# Patient Record
Sex: Female | Born: 1945 | Race: White | Hispanic: No | Marital: Married | Smoking: Never smoker
Health system: Southern US, Community
[De-identification: ages and names within clinical notes are randomized; demographics above are authoritative.]

## PROBLEM LIST (undated history)

## (undated) DIAGNOSIS — M545 Low back pain, unspecified: Secondary | ICD-10-CM

## (undated) DIAGNOSIS — F329 Major depressive disorder, single episode, unspecified: Secondary | ICD-10-CM

## (undated) DIAGNOSIS — E785 Hyperlipidemia, unspecified: Secondary | ICD-10-CM

## (undated) DIAGNOSIS — G8929 Other chronic pain: Secondary | ICD-10-CM

## (undated) DIAGNOSIS — K219 Gastro-esophageal reflux disease without esophagitis: Secondary | ICD-10-CM

## (undated) DIAGNOSIS — F32A Depression, unspecified: Secondary | ICD-10-CM

## (undated) DIAGNOSIS — I10 Essential (primary) hypertension: Secondary | ICD-10-CM

## (undated) DIAGNOSIS — E119 Type 2 diabetes mellitus without complications: Secondary | ICD-10-CM

---

## 2004-03-10 ENCOUNTER — Ambulatory Visit: Payer: Self-pay | Admitting: Internal Medicine

## 2005-04-14 ENCOUNTER — Ambulatory Visit: Payer: Self-pay | Admitting: Internal Medicine

## 2005-08-01 ENCOUNTER — Ambulatory Visit: Payer: Self-pay | Admitting: Gastroenterology

## 2006-05-04 ENCOUNTER — Ambulatory Visit: Payer: Self-pay | Admitting: Internal Medicine

## 2007-05-07 ENCOUNTER — Ambulatory Visit: Payer: Self-pay | Admitting: Internal Medicine

## 2008-05-07 ENCOUNTER — Ambulatory Visit: Payer: Self-pay | Admitting: Internal Medicine

## 2008-05-21 ENCOUNTER — Ambulatory Visit: Payer: Self-pay | Admitting: Internal Medicine

## 2009-05-12 ENCOUNTER — Ambulatory Visit: Payer: Self-pay | Admitting: Internal Medicine

## 2010-05-13 ENCOUNTER — Ambulatory Visit: Payer: Self-pay | Admitting: Internal Medicine

## 2011-06-27 ENCOUNTER — Ambulatory Visit: Payer: Self-pay | Admitting: Internal Medicine

## 2012-06-27 ENCOUNTER — Ambulatory Visit: Payer: Self-pay | Admitting: Internal Medicine

## 2012-08-27 ENCOUNTER — Ambulatory Visit: Payer: Self-pay | Admitting: Internal Medicine

## 2012-09-06 ENCOUNTER — Ambulatory Visit: Payer: Self-pay | Admitting: Internal Medicine

## 2012-10-07 ENCOUNTER — Ambulatory Visit: Payer: Self-pay | Admitting: Internal Medicine

## 2012-11-06 ENCOUNTER — Ambulatory Visit: Payer: Self-pay | Admitting: Internal Medicine

## 2013-06-28 ENCOUNTER — Ambulatory Visit: Payer: Self-pay | Admitting: Internal Medicine

## 2014-07-14 ENCOUNTER — Ambulatory Visit: Payer: Self-pay | Admitting: Internal Medicine

## 2015-08-07 ENCOUNTER — Other Ambulatory Visit: Payer: Self-pay | Admitting: Internal Medicine

## 2015-08-07 DIAGNOSIS — Z1231 Encounter for screening mammogram for malignant neoplasm of breast: Secondary | ICD-10-CM

## 2015-08-21 ENCOUNTER — Other Ambulatory Visit: Payer: Self-pay | Admitting: Internal Medicine

## 2015-08-21 ENCOUNTER — Ambulatory Visit
Admission: RE | Admit: 2015-08-21 | Discharge: 2015-08-21 | Disposition: A | Discharge status: Home / Self Care | Payer: Medicare Other | Source: Ambulatory Visit | Attending: Internal Medicine | Admitting: Internal Medicine

## 2015-08-21 DIAGNOSIS — N6489 Other specified disorders of breast: Secondary | ICD-10-CM | POA: Insufficient documentation

## 2015-08-21 DIAGNOSIS — Z1231 Encounter for screening mammogram for malignant neoplasm of breast: Secondary | ICD-10-CM | POA: Diagnosis not present

## 2016-02-04 ENCOUNTER — Encounter: Payer: Self-pay | Admitting: *Deleted

## 2016-02-05 ENCOUNTER — Encounter
Admission: RE | Disposition: A | Discharge status: Home / Self Care | Payer: Self-pay | Source: Ambulatory Visit | Attending: Gastroenterology

## 2016-02-05 ENCOUNTER — Ambulatory Visit
Admission: RE | Admit: 2016-02-05 | Discharge: 2016-02-05 | Disposition: A | Discharge status: Home / Self Care | Payer: Medicare Other | Source: Ambulatory Visit | Attending: Gastroenterology | Admitting: Gastroenterology

## 2016-02-05 ENCOUNTER — Ambulatory Visit: Payer: Medicare Other | Admitting: Certified Registered"

## 2016-02-05 ENCOUNTER — Encounter: Payer: Self-pay | Admitting: *Deleted

## 2016-02-05 DIAGNOSIS — I10 Essential (primary) hypertension: Secondary | ICD-10-CM | POA: Insufficient documentation

## 2016-02-05 DIAGNOSIS — Z86010 Personal history of colonic polyps: Secondary | ICD-10-CM | POA: Insufficient documentation

## 2016-02-05 DIAGNOSIS — K219 Gastro-esophageal reflux disease without esophagitis: Secondary | ICD-10-CM | POA: Insufficient documentation

## 2016-02-05 DIAGNOSIS — E785 Hyperlipidemia, unspecified: Secondary | ICD-10-CM | POA: Diagnosis not present

## 2016-02-05 DIAGNOSIS — E119 Type 2 diabetes mellitus without complications: Secondary | ICD-10-CM | POA: Insufficient documentation

## 2016-02-05 DIAGNOSIS — Z7982 Long term (current) use of aspirin: Secondary | ICD-10-CM | POA: Insufficient documentation

## 2016-02-05 DIAGNOSIS — Z7984 Long term (current) use of oral hypoglycemic drugs: Secondary | ICD-10-CM | POA: Diagnosis not present

## 2016-02-05 DIAGNOSIS — Z800 Family history of malignant neoplasm of digestive organs: Secondary | ICD-10-CM | POA: Diagnosis not present

## 2016-02-05 DIAGNOSIS — K5730 Diverticulosis of large intestine without perforation or abscess without bleeding: Secondary | ICD-10-CM | POA: Diagnosis not present

## 2016-02-05 DIAGNOSIS — Z79899 Other long term (current) drug therapy: Secondary | ICD-10-CM | POA: Insufficient documentation

## 2016-02-05 HISTORY — DX: Essential (primary) hypertension: I10

## 2016-02-05 HISTORY — PX: COLONOSCOPY WITH PROPOFOL: SHX5780

## 2016-02-05 HISTORY — DX: Low back pain, unspecified: M54.50

## 2016-02-05 HISTORY — DX: Depression, unspecified: F32.A

## 2016-02-05 HISTORY — DX: Gastro-esophageal reflux disease without esophagitis: K21.9

## 2016-02-05 HISTORY — DX: Hyperlipidemia, unspecified: E78.5

## 2016-02-05 HISTORY — DX: Other chronic pain: G89.29

## 2016-02-05 HISTORY — DX: Low back pain: M54.5

## 2016-02-05 HISTORY — DX: Major depressive disorder, single episode, unspecified: F32.9

## 2016-02-05 HISTORY — DX: Type 2 diabetes mellitus without complications: E11.9

## 2016-02-05 LAB — GLUCOSE, CAPILLARY: GLUCOSE-CAPILLARY: 130 mg/dL — AB (ref 65–99)

## 2016-02-05 SURGERY — COLONOSCOPY WITH PROPOFOL
Anesthesia: General

## 2016-02-05 MED ORDER — PROPOFOL 10 MG/ML IV BOLUS
INTRAVENOUS | Status: DC | PRN
  Administered 2016-02-05: 50 mg via INTRAVENOUS

## 2016-02-05 MED ORDER — PROPOFOL 500 MG/50ML IV EMUL
INTRAVENOUS | Status: DC | PRN
  Administered 2016-02-05: 140 ug/kg/min via INTRAVENOUS

## 2016-02-05 MED ORDER — SODIUM CHLORIDE 0.9 % IV SOLN
INTRAVENOUS | Status: DC
  Administered 2016-02-05 (×2): via INTRAVENOUS

## 2016-02-05 MED ORDER — SODIUM CHLORIDE 0.9 % IV SOLN: INTRAVENOUS | Status: DC

## 2016-02-05 MED ORDER — LIDOCAINE HCL (CARDIAC) 20 MG/ML IV SOLN
INTRAVENOUS | Status: DC | PRN
  Administered 2016-02-05: 60 mg via INTRAVENOUS

## 2016-02-05 NOTE — Op Note (Signed)
[facility] Gastroenterology Patient Name: Patty Robinson Procedure Date: 02/05/2016 9:16 AM MRN: [id number] Account #: [id number] Date of Birth: 10/30/1945 Admit Type: Outpatient Age: 70 Room: [facility] ENDO ROOM 4 Gender: Female Note Status: Finalized Procedure:            Colonoscopy Indications:          Family history of colon cancer in a first-degree                        relative, Family history of colon cancer in a distant                        relative, Personal history of colonic polyps Providers:            Christena Deem, MD Referring MD:         Danella Penton, MD (Referring MD) Medicines:            Monitored Anesthesia Care Complications:        No immediate complications. Procedure:            Pre-Anesthesia Assessment:                       - ASA Grade Assessment: II - A patient with mild                        systemic disease.                       After obtaining informed consent, the colonoscope was                        passed under direct vision. Throughout the procedure,                        the patient's blood pressure, pulse, and oxygen                        saturations were monitored continuously. The                        Colonoscope was introduced through the anus and                        advanced to the the cecum, identified by appendiceal                        orifice and ileocecal valve. The colonoscopy was                        performed without difficulty. The patient tolerated the                        procedure well. The quality of the bowel preparation                        was good. Findings:      Multiple small and large-mouthed diverticula were found in the sigmoid       colon, descending colon, transverse colon and ascending colon.      The exam was otherwise without abnormality.      The  digital rectal exam was normal. Impression:           - Diverticulosis in the sigmoid colon, in the   descending colon, in the transverse colon and in the                        ascending colon.                       - The examination was otherwise normal.                       - No specimens collected. Recommendation:       - Repeat colonoscopy in 5 years for screening purposes. Procedure Code(s):    --- Professional ---                       [id number], Colonoscopy, flexible; diagnostic, including                        collection of specimen(s) by brushing or washing, when                        performed (separate procedure) Diagnosis Code(s):    --- Professional ---                       Z80.0, Family history of malignant neoplasm of                        digestive organs                       Z86.010, Personal history of colonic polyps                       K57.30, Diverticulosis of large intestine without                        perforation or abscess without bleeding CPT copyright 2016 [employer]. All rights reserved. The codes documented in this report are preliminary and upon coder review may  be revised to meet current compliance requirements. Christena DeemMartin U Skulskie, MD 02/05/2016 9:46:31 AM This report has been signed electronically. Number of Addenda: 0 Note Initiated On: 02/05/2016 9:16 AM Scope Withdrawal Time: 0 hours 5 minutes 20 seconds  Total Procedure Duration: 0 hours 10 minutes 33 seconds       [facility]

## 2016-02-05 NOTE — Anesthesia Preprocedure Evaluation (Signed)
Anesthesia Evaluation  Patient identified by MRN, date of birth, ID band Patient awake    Reviewed: Allergy & Precautions, H&P , NPO status , Patient's Chart, lab work & pertinent test results, reviewed documented beta blocker date and time   History of Anesthesia Complications Negative for: history of anesthetic complications  Airway Mallampati: I  TM Distance: >3 FB Neck ROM: full    Dental no notable dental hx. (+) Caps, Teeth Intact, Missing   Pulmonary neg pulmonary ROS,    Pulmonary exam normal breath sounds clear to auscultation       Cardiovascular Exercise Tolerance: Good hypertension, (-) angina(-) CAD, (-) Past MI, (-) Cardiac Stents and (-) CABG Normal cardiovascular exam(-) dysrhythmias (-) Valvular Problems/Murmurs Rhythm:regular Rate:Normal     Neuro/Psych PSYCHIATRIC DISORDERS (Depression) negative neurological ROS     GI/Hepatic Neg liver ROS, GERD  ,  Endo/Other  diabetes, Well Controlled, Oral Hypoglycemic Agents  Renal/GU negative Renal ROS  negative genitourinary   Musculoskeletal   Abdominal   Peds  Hematology negative hematology ROS (+)   Anesthesia Other Findings Past Medical History: No date: Chronic lumbar pain No date: Depression No date: Diabetes mellitus without complication (HCC) No date: Hyperlipidemia No date: Hypertension No date: Laryngopharyngeal reflux   Reproductive/Obstetrics negative OB ROS                             Anesthesia Physical Anesthesia Plan  ASA: II  Anesthesia Plan: General   Post-op Pain Management:    Induction:   Airway Management Planned:   Additional Equipment:   Intra-op Plan:   Post-operative Plan:   Informed Consent: I have reviewed the patients History and Physical, chart, labs and discussed the procedure including the risks, benefits and alternatives for the proposed anesthesia with the patient or authorized  representative who has indicated his/her understanding and acceptance.   Dental Advisory Given  Plan Discussed with: Anesthesiologist, CRNA and Surgeon  Anesthesia Plan Comments:         Anesthesia Quick Evaluation

## 2016-02-05 NOTE — Transfer of Care (Signed)
Immediate Anesthesia Transfer of Care Note  Patient: Tanya NonesJo A Goedken  Procedure(s) Performed: Procedure(s): COLONOSCOPY WITH PROPOFOL (N/A)  Patient Location: Endoscopy Unit  Anesthesia Type:General  Level of Consciousness: awake, alert , oriented and patient cooperative  Airway & Oxygen Therapy: Patient Spontanous Breathing and Patient connected to nasal cannula oxygen  Post-op Assessment: Report given to RN, Post -op Vital signs reviewed and stable and Patient moving all extremities X 4  Post vital signs: Reviewed and stable  Last Vitals:  Vitals:   02/05/16 0834  BP: 136/68  Pulse: 60  Resp: 14  Temp: 36.7 C    Last Pain:  Vitals:   02/05/16 0834  TempSrc: Tympanic         Complications: No apparent anesthesia complications

## 2016-02-05 NOTE — H&P (Signed)
Outpatient short stay form Pre-procedure 02/05/2016 9:00 AM Patty DeemMartin [name] MD  Primary Physician: Dr. Bethann PunchesMark Miller  Reason for visit:  Colonoscopy  History of present illness:  Patient is a 70 year old female presenting today as above. She has a personal history of adenomatous colon polyps and a family history of colon cancer in multiple relatives including a primary relative. She tolerated her prep well. She takes some 81 mg aspirin but has held that for a couple days. She takes no other aspirin products or blood thinning agents.    Current Facility-Administered Medications:  .  0.9 %  sodium chloride infusion, , Intravenous, Continuous, Patty DeemMartin U [name], MD, Last Rate: 20 mL/hr at 02/05/16 0856 .  0.9 %  sodium chloride infusion, , Intravenous, Continuous, Patty DeemMartin [name], MD  Prescriptions Prior to Admission  Medication Sig Dispense Refill Last Dose  . aspirin EC 81 MG tablet Take 81 mg by mouth daily.   02/03/2016 at 2100  . escitalopram (LEXAPRO) 10 MG tablet Take 10 mg by mouth daily.   02/04/2016 at Unknown time  . metFORMIN (GLUCOPHAGE) 500 MG tablet Take 500 mg by mouth daily with breakfast.   02/04/2016 at Unknown time  . ranitidine (ZANTAC) 150 MG capsule Take 150 mg by mouth 2 (two) times daily.   02/04/2016 at 2100  . telmisartan-hydrochlorothiazide (MICARDIS HCT) 80-12.5 MG tablet Take 1 tablet by mouth daily.   02/04/2016 at 2100  . vitamin E 400 UNIT capsule Take 400 Units by mouth daily.        Allergies  Allergen Reactions  . Other Hives    Macadamia Nuts 'causes itching and hives'     Past Medical History:  Diagnosis Date  . Chronic lumbar pain   . Depression   . Diabetes mellitus without complication (HCC)   . Hyperlipidemia   . Hypertension   . Laryngopharyngeal reflux     Review of systems:      Physical Exam    Heart and lungs: Regular rate and rhythm without rub or gallop, lungs are bilaterally clear.    HEENT: Normocephalic atraumatic eyes  are anicteric    Other:     Pertinant exam for procedure: Soft nontender nondistended bowel sounds positive normoactive.    Planned proceedures: Colonoscopy with indicated procedures. I have discussed the risks benefits and complications of procedures to include not limited to bleeding, infection, perforation and the risk of sedation and the patient wishes to proceed.    Patty DeemMartin U Orva Gwaltney, MD Gastroenterology 02/05/2016  9:00 AM

## 2016-02-05 NOTE — Anesthesia Postprocedure Evaluation (Signed)
Anesthesia Post Note  Patient: Patty Robinson  Procedure(s) Performed: Procedure(s) (LRB): COLONOSCOPY WITH PROPOFOL (N/A)  Patient location during evaluation: Endoscopy Anesthesia Type: General Level of consciousness: awake and alert Pain management: pain level controlled Vital Signs Assessment: post-procedure vital signs reviewed and stable Respiratory status: spontaneous breathing, nonlabored ventilation, respiratory function stable and patient connected to nasal cannula oxygen Cardiovascular status: blood pressure returned to baseline and stable Postop Assessment: no signs of nausea or vomiting Anesthetic complications: no    Last Vitals:  Vitals:   02/05/16 0834 02/05/16 0940  BP: 136/68 103/62  Pulse: 60 60  Resp: 14 18  Temp: 36.7 C (!) 35.7 C    Last Pain:  Vitals:   02/05/16 0940  TempSrc: Tympanic                 Lenard SimmerAndrew Laval Cafaro

## 2016-02-08 ENCOUNTER — Encounter: Payer: Self-pay | Admitting: Gastroenterology

## 2016-08-15 ENCOUNTER — Other Ambulatory Visit: Payer: Self-pay | Admitting: Internal Medicine

## 2016-08-15 DIAGNOSIS — Z1231 Encounter for screening mammogram for malignant neoplasm of breast: Secondary | ICD-10-CM

## 2016-09-07 ENCOUNTER — Ambulatory Visit
Admission: RE | Admit: 2016-09-07 | Discharge: 2016-09-07 | Disposition: A | Discharge status: Home / Self Care | Payer: Medicare Other | Source: Ambulatory Visit | Attending: Internal Medicine | Admitting: Internal Medicine

## 2016-09-07 DIAGNOSIS — Z1231 Encounter for screening mammogram for malignant neoplasm of breast: Secondary | ICD-10-CM | POA: Diagnosis present

## 2017-08-10 ENCOUNTER — Other Ambulatory Visit: Payer: Self-pay | Admitting: Internal Medicine

## 2017-08-10 DIAGNOSIS — Z1231 Encounter for screening mammogram for malignant neoplasm of breast: Secondary | ICD-10-CM

## 2017-09-08 ENCOUNTER — Ambulatory Visit
Admission: RE | Admit: 2017-09-08 | Discharge: 2017-09-08 | Disposition: A | Discharge status: Home / Self Care | Payer: Medicare Other | Source: Ambulatory Visit | Attending: Internal Medicine | Admitting: Internal Medicine

## 2017-09-08 DIAGNOSIS — Z1231 Encounter for screening mammogram for malignant neoplasm of breast: Secondary | ICD-10-CM | POA: Diagnosis not present

## 2018-08-15 ENCOUNTER — Other Ambulatory Visit: Payer: Self-pay | Admitting: Internal Medicine

## 2018-08-15 DIAGNOSIS — Z1231 Encounter for screening mammogram for malignant neoplasm of breast: Secondary | ICD-10-CM

## 2018-10-08 ENCOUNTER — Other Ambulatory Visit: Payer: Self-pay

## 2018-10-08 ENCOUNTER — Ambulatory Visit
Admission: RE | Admit: 2018-10-08 | Discharge: 2018-10-08 | Disposition: A | Discharge status: Home / Self Care | Payer: Medicare Other | Source: Ambulatory Visit | Attending: Internal Medicine | Admitting: Internal Medicine

## 2018-10-08 DIAGNOSIS — Z1231 Encounter for screening mammogram for malignant neoplasm of breast: Secondary | ICD-10-CM | POA: Insufficient documentation

## 2019-09-03 ENCOUNTER — Other Ambulatory Visit: Payer: Self-pay | Admitting: Internal Medicine

## 2019-09-03 DIAGNOSIS — Z1231 Encounter for screening mammogram for malignant neoplasm of breast: Secondary | ICD-10-CM

## 2019-10-10 ENCOUNTER — Ambulatory Visit
Admission: RE | Admit: 2019-10-10 | Discharge: 2019-10-10 | Disposition: A | Discharge status: Home / Self Care | Payer: Medicare PPO | Source: Ambulatory Visit | Attending: Internal Medicine | Admitting: Internal Medicine

## 2019-10-10 DIAGNOSIS — Z1231 Encounter for screening mammogram for malignant neoplasm of breast: Secondary | ICD-10-CM | POA: Insufficient documentation

## 2020-09-04 ENCOUNTER — Other Ambulatory Visit: Payer: Self-pay | Admitting: Internal Medicine

## 2020-09-04 DIAGNOSIS — Z1231 Encounter for screening mammogram for malignant neoplasm of breast: Secondary | ICD-10-CM

## 2020-10-13 ENCOUNTER — Other Ambulatory Visit: Payer: Self-pay

## 2020-10-13 ENCOUNTER — Ambulatory Visit
Admission: RE | Admit: 2020-10-13 | Discharge: 2020-10-13 | Disposition: A | Discharge status: Home / Self Care | Payer: Medicare PPO | Source: Ambulatory Visit | Attending: Internal Medicine | Admitting: Internal Medicine

## 2020-10-13 DIAGNOSIS — Z1231 Encounter for screening mammogram for malignant neoplasm of breast: Secondary | ICD-10-CM | POA: Insufficient documentation

## 2021-08-09 ENCOUNTER — Ambulatory Visit: Payer: Medicare PPO | Admitting: Anesthesiology

## 2021-08-09 ENCOUNTER — Ambulatory Visit
Admission: RE | Admit: 2021-08-09 | Discharge: 2021-08-09 | Disposition: A | Discharge status: Home / Self Care | Payer: Medicare PPO | Attending: Gastroenterology | Admitting: Gastroenterology

## 2021-08-09 ENCOUNTER — Encounter: Payer: Self-pay | Admitting: *Deleted

## 2021-08-09 ENCOUNTER — Other Ambulatory Visit: Payer: Self-pay

## 2021-08-09 ENCOUNTER — Encounter
Admission: RE | Disposition: A | Discharge status: Home / Self Care | Payer: Self-pay | Source: Home / Self Care | Attending: Gastroenterology

## 2021-08-09 DIAGNOSIS — Z800 Family history of malignant neoplasm of digestive organs: Secondary | ICD-10-CM | POA: Insufficient documentation

## 2021-08-09 DIAGNOSIS — Z79899 Other long term (current) drug therapy: Secondary | ICD-10-CM | POA: Diagnosis not present

## 2021-08-09 DIAGNOSIS — Z7984 Long term (current) use of oral hypoglycemic drugs: Secondary | ICD-10-CM | POA: Diagnosis not present

## 2021-08-09 DIAGNOSIS — Z86010 Personal history of colonic polyps: Secondary | ICD-10-CM | POA: Insufficient documentation

## 2021-08-09 DIAGNOSIS — K219 Gastro-esophageal reflux disease without esophagitis: Secondary | ICD-10-CM | POA: Insufficient documentation

## 2021-08-09 DIAGNOSIS — F32A Depression, unspecified: Secondary | ICD-10-CM | POA: Diagnosis not present

## 2021-08-09 DIAGNOSIS — K5730 Diverticulosis of large intestine without perforation or abscess without bleeding: Secondary | ICD-10-CM | POA: Insufficient documentation

## 2021-08-09 DIAGNOSIS — K640 First degree hemorrhoids: Secondary | ICD-10-CM | POA: Insufficient documentation

## 2021-08-09 DIAGNOSIS — K2289 Other specified disease of esophagus: Secondary | ICD-10-CM | POA: Insufficient documentation

## 2021-08-09 DIAGNOSIS — E119 Type 2 diabetes mellitus without complications: Secondary | ICD-10-CM | POA: Diagnosis not present

## 2021-08-09 DIAGNOSIS — K317 Polyp of stomach and duodenum: Secondary | ICD-10-CM | POA: Insufficient documentation

## 2021-08-09 DIAGNOSIS — I10 Essential (primary) hypertension: Secondary | ICD-10-CM | POA: Insufficient documentation

## 2021-08-09 DIAGNOSIS — Z1211 Encounter for screening for malignant neoplasm of colon: Secondary | ICD-10-CM | POA: Diagnosis present

## 2021-08-09 HISTORY — PX: COLONOSCOPY WITH PROPOFOL: SHX5780

## 2021-08-09 HISTORY — PX: ESOPHAGOGASTRODUODENOSCOPY (EGD) WITH PROPOFOL: SHX5813

## 2021-08-09 LAB — GLUCOSE, CAPILLARY: Glucose-Capillary: 128 mg/dL — ABNORMAL HIGH (ref 70–99)

## 2021-08-09 SURGERY — COLONOSCOPY WITH PROPOFOL
Anesthesia: General

## 2021-08-09 MED ORDER — PROPOFOL 500 MG/50ML IV EMUL
INTRAVENOUS | Status: AC
  Filled 2021-08-09: qty 500

## 2021-08-09 MED ORDER — PROPOFOL 10 MG/ML IV BOLUS
INTRAVENOUS | Status: DC | PRN
  Administered 2021-08-09: 20 mg via INTRAVENOUS
  Administered 2021-08-09: 30 mg via INTRAVENOUS

## 2021-08-09 MED ORDER — PROPOFOL 500 MG/50ML IV EMUL
INTRAVENOUS | Status: DC | PRN
  Administered 2021-08-09: 50 ug/kg/min via INTRAVENOUS

## 2021-08-09 MED ORDER — SODIUM CHLORIDE 0.9 % IV SOLN: INTRAVENOUS | Status: DC

## 2021-08-09 MED ORDER — MIDAZOLAM HCL 2 MG/2ML IJ SOLN
INTRAMUSCULAR | Status: AC
  Filled 2021-08-09: qty 2

## 2021-08-09 MED ORDER — MIDAZOLAM HCL 2 MG/2ML IJ SOLN
INTRAMUSCULAR | Status: DC | PRN
  Administered 2021-08-09: 2 mg via INTRAVENOUS

## 2021-08-09 MED ORDER — FENTANYL CITRATE (PF) 100 MCG/2ML IJ SOLN
INTRAMUSCULAR | Status: AC
Start: 2021-08-09 — End: ?
  Filled 2021-08-09: qty 2

## 2021-08-09 MED ORDER — FENTANYL CITRATE (PF) 100 MCG/2ML IJ SOLN
INTRAMUSCULAR | Status: DC | PRN
Start: 2021-08-09 — End: 2021-08-09
  Administered 2021-08-09: 50 ug via INTRAVENOUS
  Administered 2021-08-09 (×2): 25 ug via INTRAVENOUS

## 2021-08-09 MED ORDER — LIDOCAINE HCL (CARDIAC) PF 100 MG/5ML IV SOSY
PREFILLED_SYRINGE | INTRAVENOUS | Status: DC | PRN
  Administered 2021-08-09: 50 mg via INTRAVENOUS

## 2021-08-09 NOTE — Transfer of Care (Signed)
Immediate Anesthesia Transfer of Care Note ? ?Patient: Patty Robinson ? ?Procedure(s) Performed: COLONOSCOPY WITH PROPOFOL ?ESOPHAGOGASTRODUODENOSCOPY (EGD) WITH PROPOFOL ? ?Patient Location: PACU ? ?Anesthesia Type:General ? ?Level of Consciousness: sedated ? ?Airway & Oxygen Therapy: Patient Spontanous Breathing and Patient connected to nasal cannula oxygen ? ?Post-op Assessment: Report given to RN and Post -op Vital signs reviewed and stable ? ?Post vital signs: Reviewed and stable ? ?Last Vitals:  ?Vitals Value Taken Time  ?BP    ?Temp    ?Pulse    ?Resp    ?SpO2    ? ? ?Last Pain:  ?Vitals:  ? 08/09/21 0727  ?TempSrc: Temporal  ?PainSc: 0-No pain  ?   ? ?  ? ?Complications: No notable events documented. ?

## 2021-08-09 NOTE — H&P (Signed)
Outpatient short stay form Pre-procedure ?08/09/2021  ?Regis Bill, MD ? ?Primary Physician: Danella Penton, MD ? ?Reason for visit:  GERD/Surveillance and family history of colon cancer ? ?History of present illness:   ? ?76 y/o lady with history of GERD diagnosed by hoarseness that responded to PPI here for EGD/Colonoscopy. Brother had colon cancer in his late 37's. Last colonoscopy in 2017 with diverticulosis but has reported history of adenomatous polyp. No blood thinners. No significant abdominal surgeries. ? ? ? ?Current Facility-Administered Medications:  ?  0.9 %  sodium chloride infusion, , Intravenous, Continuous, Delsie Amador, Rossie Muskrat, MD, Last Rate: 20 mL/hr at 08/09/21 0743, New Bag at 08/09/21 0743 ? ?Medications Prior to Admission  ?Medication Sig Dispense Refill Last Dose  ? aspirin EC 81 MG tablet Take 81 mg by mouth daily.   Past Week  ? Cholecalciferol 125 MCG (5000 UT) capsule Take 5,000 Units by mouth daily.   Past Week  ? escitalopram (LEXAPRO) 10 MG tablet Take 10 mg by mouth daily.   Past Week  ? hydrochlorothiazide (HYDRODIURIL) 25 MG tablet Take 25 mg by mouth daily.   Past Week  ? metFORMIN (GLUCOPHAGE) 500 MG tablet Take 500 mg by mouth daily with breakfast.   Past Week  ? pantoprazole (PROTONIX) 40 MG tablet Take 40 mg by mouth daily.   08/08/2021  ? ranitidine (ZANTAC) 150 MG capsule Take 150 mg by mouth 2 (two) times daily.   Past Week  ? simvastatin (ZOCOR) 10 MG tablet Take 10 mg by mouth daily.   Past Week  ? telmisartan-hydrochlorothiazide (MICARDIS HCT) 80-12.5 MG tablet Take 1 tablet by mouth daily.   Past Week  ? vitamin E 400 UNIT capsule Take 400 Units by mouth daily.   Past Week  ? ? ? ?Allergies  ?Allergen Reactions  ? Other Hives  ?  Macadamia Nuts ?'causes itching and hives'  ? ? ? ?Past Medical History:  ?Diagnosis Date  ? Chronic lumbar pain   ? Depression   ? Diabetes mellitus without complication (HCC)   ? Hyperlipidemia   ? Hypertension   ? Laryngopharyngeal reflux    ? ? ?Review of systems:  Otherwise negative.  ? ? ?Physical Exam ? ?Gen: Alert, oriented. Appears stated age.  ?HEENT: PERRLA. ?Lungs: No respiratory distress ?CV: RRR ?Abd: soft, benign, no masses ?Ext: No edema ? ? ? ?Planned procedures: Proceed with EGD/colonoscopy. The patient understands the nature of the planned procedure, indications, risks, alternatives and potential complications including but not limited to bleeding, infection, perforation, damage to internal organs and possible oversedation/side effects from anesthesia. The patient agrees and gives consent to proceed.  ?Please refer to procedure notes for findings, recommendations and patient disposition/instructions.  ? ? ? ?Regis Bill, MD ?[facility] ? ? ? ?  ? ?

## 2021-08-09 NOTE — Op Note (Signed)
[facility] ?Gastroenterology ?Patient Name: Patty Robinson ?Procedure Date: 08/09/2021 8:09 AM ?MRN: [id number] ?Account #: [id number] ?Date of Birth: Dec 03, 1945 ?Admit Type: Outpatient ?Age: 76 ?Room: [facility] ENDO ROOM 3 ?Gender: Female ?Note Status: Finalized ?Instrument Name: Colonscope [id number] ?Procedure:             Colonoscopy ?Indications:           High risk colon cancer surveillance: Personal history  ?                       of colonic polyps, Family history of colon cancer in a  ?                       first-degree relative before age 66 years, Last  ?                       colonoscopy 5 years ago ?Providers:             Andrey Farmer MD, MD ?Medicines:             Monitored Anesthesia Care ?Complications:         No immediate complications. ?Procedure:             Pre-Anesthesia Assessment: ?                       - Prior to the procedure, a History and Physical was  ?                       performed, and patient medications and allergies were  ?                       reviewed. The patient is competent. The risks and  ?                       benefits of the procedure and the sedation options and  ?                       risks were discussed with the patient. All questions  ?                       were answered and informed consent was obtained.  ?                       Patient identification and proposed procedure were  ?                       verified by the physician, the nurse, the  ?                       anesthesiologist, the anesthetist and the technician  ?                       in the endoscopy suite. Mental Status Examination:  ?                       alert and oriented. Airway Examination: normal  ?                       oropharyngeal airway and neck mobility.  Respiratory  ?                       Examination: clear to auscultation. CV Examination:  ?                       normal. Prophylactic Antibiotics: The patient does not  ?                       require prophylactic  antibiotics. Prior  ?                       Anticoagulants: The patient has taken no previous  ?                       anticoagulant or antiplatelet agents. ASA Grade  ?                       Assessment: II - A patient with mild systemic disease.  ?                       After reviewing the risks and benefits, the patient  ?                       was deemed in satisfactory condition to undergo the  ?                       procedure. The anesthesia plan was to use monitored  ?                       anesthesia care (MAC). Immediately prior to  ?                       administration of medications, the patient was  ?                       re-assessed for adequacy to receive sedatives. The  ?                       heart rate, respiratory rate, oxygen saturations,  ?                       blood pressure, adequacy of pulmonary ventilation, and  ?                       response to care were monitored throughout the  ?                       procedure. The physical status of the patient was  ?                       re-assessed after the procedure. ?                       After obtaining informed consent, the colonoscope was  ?                       passed under direct vision. Throughout the procedure,  ?  the patient's blood pressure, pulse, and oxygen  ?                       saturations were monitored continuously. The  ?                       Colonoscope was introduced through the anus and  ?                       advanced to the the cecum, identified by appendiceal  ?                       orifice and ileocecal valve. The colonoscopy was  ?                       performed without difficulty. The patient tolerated  ?                       the procedure well. The quality of the bowel  ?                       preparation was good. ?Findings: ?     The perianal and digital rectal examinations were normal. ?     A few small-mouthed diverticula were found in the sigmoid colon,  ?     descending colon,  transverse colon and hepatic flexure. ?     Internal hemorrhoids were found during retroflexion. The hemorrhoids  ?     were Grade I (internal hemorrhoids that do not prolapse). ?     The exam was otherwise without abnormality on direct and retroflexion  ?     views. ?Impression:            - Diverticulosis in the sigmoid colon, in the  ?                       descending colon, in the transverse colon and at the  ?                       hepatic flexure. ?                       - Internal hemorrhoids. ?                       - The examination was otherwise normal on direct and  ?                       retroflexion views. ?                       - No specimens collected. ?Recommendation:        - Discharge patient to home. ?                       - Resume previous diet. ?                       - Continue present medications. ?                       - Repeat colonoscopy in 5 years for surveillance. ?                       -  Return to referring physician as previously  ?                       scheduled. ?Procedure Code(s):     --- Professional --- ?                       G0105, Colorectal cancer screening; colonoscopy on  ?                       individual at high risk ?Diagnosis Code(s):     --- Professional --- ?                       Z86.010, Personal history of colonic polyps ?                       K64.0, First degree hemorrhoids ?                       Z80.0, Family history of malignant neoplasm of  ?                       digestive organs ?                       K57.30, Diverticulosis of large intestine without  ?                       perforation or abscess without bleeding ?CPT copyright 2019 American Medical Association. All rights reserved. ?The codes documented in this report are preliminary and upon coder review may  ?be revised to meet current compliance requirements. ?Andrey Farmer MD, MD ?08/09/2021 8:56:18 AM ?Number of Addenda: 0 ?Note Initiated On: 08/09/2021 8:09 AM ?Scope Withdrawal Time: 0 hours 6  minutes 44 seconds  ?Total Procedure Duration: 0 hours 12 minutes 37 seconds  ?Estimated Blood Loss:  Estimated blood loss: none. ?     [facility] ?

## 2021-08-09 NOTE — Op Note (Signed)
[facility] ?Gastroenterology ?Patient Name: Patty Robinson ?Procedure Date: 08/09/2021 8:10 AM ?MRN: [id number] ?Account #: [id number] ?Date of Birth: August 21, 1945 ?Admit Type: Outpatient ?Age: 77 ?Room: [facility] ENDO ROOM 3 ?Gender: Female ?Note Status: Finalized ?Instrument Name: Upper Endoscope [id number] ?Procedure:             Upper GI endoscopy ?Indications:           Gastro-esophageal reflux disease ?Providers:             Andrey Farmer MD, MD ?Medicines:             Monitored Anesthesia Care ?Complications:         No immediate complications. Estimated blood loss:  ?                       Minimal. ?Procedure:             Pre-Anesthesia Assessment: ?                       - Prior to the procedure, a History and Physical was  ?                       performed, and patient medications and allergies were  ?                       reviewed. The patient is competent. The risks and  ?                       benefits of the procedure and the sedation options and  ?                       risks were discussed with the patient. All questions  ?                       were answered and informed consent was obtained.  ?                       Patient identification and proposed procedure were  ?                       verified by the physician, the nurse, the  ?                       anesthesiologist, the anesthetist and the technician  ?                       in the endoscopy suite. Mental Status Examination:  ?                       alert and oriented. Airway Examination: normal  ?                       oropharyngeal airway and neck mobility. Respiratory  ?                       Examination: clear to auscultation. CV Examination:  ?                       normal. Prophylactic Antibiotics: The patient does not  ?  require prophylactic antibiotics. Prior  ?                       Anticoagulants: The patient has taken no previous  ?                       anticoagulant or antiplatelet agents. ASA Grade   ?                       Assessment: II - A patient with mild systemic disease.  ?                       After reviewing the risks and benefits, the patient  ?                       was deemed in satisfactory condition to undergo the  ?                       procedure. The anesthesia plan was to use monitored  ?                       anesthesia care (MAC). Immediately prior to  ?                       administration of medications, the patient was  ?                       re-assessed for adequacy to receive sedatives. The  ?                       heart rate, respiratory rate, oxygen saturations,  ?                       blood pressure, adequacy of pulmonary ventilation, and  ?                       response to care were monitored throughout the  ?                       procedure. The physical status of the patient was  ?                       re-assessed after the procedure. ?                       After obtaining informed consent, the endoscope was  ?                       passed under direct vision. Throughout the procedure,  ?                       the patient's blood pressure, pulse, and oxygen  ?                       saturations were monitored continuously. The Endoscope  ?                       was introduced through the mouth, and advanced to the  ?  second part of duodenum. The upper GI endoscopy was  ?                       accomplished without difficulty. The patient tolerated  ?                       the procedure well. ?Findings: ?     The examined esophagus was normal. Biopsies were obtained from the  ?     proximal and distal esophagus with cold forceps for histology of  ?     suspected eosinophilic esophagitis. Estimated blood loss was minimal. ?     A few small sessile fundic gland polyps with no bleeding and no stigmata  ?     of recent bleeding were found in the gastric body. Biopsies were taken  ?     with a cold forceps for histology. Estimated blood loss was minimal. ?      The exam of the stomach was otherwise normal. ?     The examined duodenum was normal. ?Impression:            - Normal esophagus. Biopsied. ?                       - A few fundic gland polyps. Biopsied. ?                       - Normal examined duodenum. ?Recommendation:        - Discharge patient to home. ?                       - Resume previous diet. ?                       - Continue present medications. ?                       - Await pathology results. ?                       - Perform a colonoscopy today. ?Procedure Code(s):     --- Professional --- ?                       [phone], Esophagogastroduodenoscopy, flexible,  ?                       transoral; with biopsy, single or multiple ?Diagnosis Code(s):     --- Professional --- ?                       K31.7, Polyp of stomach and duodenum ?                       K21.9, Gastro-esophageal reflux disease without  ?                       esophagitis ?CPT copyright 2019 [employer]. All rights reserved. ?The codes documented in this report are preliminary and upon coder review may  ?be revised to meet current compliance requirements. ?Andrey Farmer MD, MD ?08/09/2021 8:52:46 AM ?Number of Addenda: 0 ?Note Initiated On: 08/09/2021 8:10 AM ?Estimated Blood Loss:  Estimated blood loss was minimal. ?  [facility] ?

## 2021-08-09 NOTE — Interval H&P Note (Signed)
History and Physical Interval Note: ? ?08/09/2021 ?8:19 AM ? ?Patty Robinson  has presented today for surgery, with the diagnosis of Z86.010  - History of colon polyps ?K21.9 - Gastroesophageal reflux disease, unspecified whether esophagitis present.  The various methods of treatment have been discussed with the patient and family. After consideration of risks, benefits and other options for treatment, the patient has consented to  Procedure(s): ?COLONOSCOPY WITH PROPOFOL (N/A) ?ESOPHAGOGASTRODUODENOSCOPY (EGD) WITH PROPOFOL (N/A) as a surgical intervention.  The patient's history has been reviewed, patient examined, no change in status, stable for surgery.  I have reviewed the patient's chart and labs.  Questions were answered to the patient's satisfaction.   ? ? ?Patty Robinson ? ?Ok to proceed with EGD/Colonoscopy ?

## 2021-08-09 NOTE — Anesthesia Postprocedure Evaluation (Signed)
Anesthesia Post Note ? ?Patient: Patty Robinson ? ?Procedure(s) Performed: COLONOSCOPY WITH PROPOFOL ?ESOPHAGOGASTRODUODENOSCOPY (EGD) WITH PROPOFOL ? ?Patient location during evaluation: PACU ?Anesthesia Type: General ?Level of consciousness: awake and alert ?Pain management: pain level controlled ?Vital Signs Assessment: post-procedure vital signs reviewed and stable ?Respiratory status: spontaneous breathing, nonlabored ventilation, respiratory function stable and patient connected to nasal cannula oxygen ?Cardiovascular status: blood pressure returned to baseline and stable ?Postop Assessment: no apparent nausea or vomiting ?Anesthetic complications: no ? ? ?No notable events documented. ? ? ?Last Vitals:  ?Vitals:  ? 08/09/21 0910 08/09/21 0920  ?BP: 134/69 124/61  ?Pulse: 60 (!) 57  ?Resp: 10 17  ?Temp:    ?SpO2: 100% 99%  ?  ?Last Pain:  ?Vitals:  ? 08/09/21 0850  ?TempSrc: Temporal  ?PainSc:   ? ? ?  ?  ?  ?  ?  ?  ? ?Yevette Edwards ? ? ? ? ?

## 2021-08-09 NOTE — Anesthesia Preprocedure Evaluation (Signed)
Anesthesia Evaluation  ?Patient identified by MRN, date of birth, ID band ?Patient awake ? ? ? ?Reviewed: ?Allergy & Precautions, H&P , NPO status , Patient's Chart, lab work & pertinent test results, reviewed documented beta blocker date and time  ? ?Airway ?Mallampati: II ? ?TM Distance: <3 FB ?Neck ROM: full ? ? ? Dental ? ?(+) Poor Dentition ?  ?Pulmonary ?neg pulmonary ROS,  ?  ?Pulmonary exam normal ? ? ? ? ? ? ? Cardiovascular ?Exercise Tolerance: Poor ?hypertension, On Medications ?negative cardio ROS ?Normal cardiovascular exam ?Rhythm:regular Rate:Normal ? ? ?  ?Neuro/Psych ?Depression negative neurological ROS ? negative psych ROS  ? GI/Hepatic ?negative GI ROS, Neg liver ROS,   ?Endo/Other  ?negative endocrine ROSdiabetes, Well Controlled, Type 2, Oral Hypoglycemic Agents ? Renal/GU ?negative Renal ROS  ?negative genitourinary ?  ?Musculoskeletal ? ? Abdominal ?  ?Peds ? Hematology ?negative hematology ROS ?(+)   ?Anesthesia Other Findings ?Past Medical History: ?No date: Chronic lumbar pain ?No date: Depression ?No date: Diabetes mellitus without complication (HCC) ?No date: Hyperlipidemia ?No date: Hypertension ?No date: Laryngopharyngeal reflux ?Past Surgical History: ?02/05/2016: COLONOSCOPY WITH PROPOFOL; N/A ?    Comment:  Procedure: COLONOSCOPY WITH PROPOFOL;  Surgeon: Cindra Eves ?             Marva Panda, MD;  Location: [facility] ENDOSCOPY;  Service:  ?             Endoscopy;  Laterality: N/A; ?BMI   ? Body Mass Index: 32.58 kg/m?  ?  ? Reproductive/Obstetrics ?negative OB ROS ? ?  ? ? ? ? ? ? ? ? ? ? ? ? ? ?  ?  ? ? ? ? ? ? ? ? ?Anesthesia Physical ?Anesthesia Plan ? ?ASA: 2 ? ?Anesthesia Plan: General  ? ?Post-op Pain Management:   ? ?Induction:  ? ?PONV Risk Score and Plan:  ? ?Airway Management Planned:  ? ?Additional Equipment:  ? ?Intra-op Plan:  ? ?Post-operative Plan:  ? ?Informed Consent: I have reviewed the patients History and Physical, chart, labs and discussed  the procedure including the risks, benefits and alternatives for the proposed anesthesia with the patient or authorized representative who has indicated his/her understanding and acceptance.  ? ? ? ?Dental Advisory Given ? ?Plan Discussed with: CRNA ? ?Anesthesia Plan Comments:   ? ? ? ? ? ? ?Anesthesia Quick Evaluation ? ?

## 2021-08-10 LAB — SURGICAL PATHOLOGY

## 2021-09-08 ENCOUNTER — Other Ambulatory Visit: Payer: Self-pay | Admitting: Internal Medicine

## 2021-09-08 DIAGNOSIS — Z1231 Encounter for screening mammogram for malignant neoplasm of breast: Secondary | ICD-10-CM

## 2021-10-14 ENCOUNTER — Ambulatory Visit
Admission: RE | Admit: 2021-10-14 | Discharge: 2021-10-14 | Disposition: A | Discharge status: Home / Self Care | Payer: Medicare PPO | Source: Ambulatory Visit | Attending: Internal Medicine | Admitting: Internal Medicine

## 2021-10-14 DIAGNOSIS — Z1231 Encounter for screening mammogram for malignant neoplasm of breast: Secondary | ICD-10-CM | POA: Diagnosis not present

## 2022-02-16 ENCOUNTER — Ambulatory Visit (LOCAL_COMMUNITY_HEALTH_CENTER): Payer: Self-pay

## 2022-02-16 DIAGNOSIS — Z23 Encounter for immunization: Secondary | ICD-10-CM

## 2022-02-16 DIAGNOSIS — Z205 Contact with and (suspected) exposure to viral hepatitis: Secondary | ICD-10-CM

## 2022-02-16 NOTE — Progress Notes (Signed)
In to nurse clinic for Hep A vaccine d/t being household contact to confirmed Hep A case. Tolerated vaccine well. VIS given to pt. Educated/counseled on Hep A. [facility] copy given to pt for records. Advised to call with any questions or concerns.

## 2022-09-06 ENCOUNTER — Other Ambulatory Visit: Payer: Self-pay | Admitting: Internal Medicine

## 2022-09-06 DIAGNOSIS — Z1231 Encounter for screening mammogram for malignant neoplasm of breast: Secondary | ICD-10-CM

## 2022-10-21 ENCOUNTER — Ambulatory Visit
Admission: RE | Admit: 2022-10-21 | Discharge: 2022-10-21 | Disposition: A | Discharge status: Home / Self Care | Payer: Medicare Other | Source: Ambulatory Visit | Attending: Internal Medicine | Admitting: Internal Medicine

## 2022-10-21 DIAGNOSIS — Z1231 Encounter for screening mammogram for malignant neoplasm of breast: Secondary | ICD-10-CM

## 2022-12-01 IMAGING — MG MM DIGITAL SCREENING BILAT W/ TOMO AND CAD
6 of 10 series · 6 of 30 positions shown · non-contrast
Comparison: Previous exam(s).

CLINICAL DATA: Screening.

EXAM:
DIGITAL SCREENING BILATERAL MAMMOGRAM WITH TOMOSYNTHESIS AND CAD
TECHNIQUE: Bilateral screening digital craniocaudal and mediolateral oblique
mammograms were obtained. Bilateral screening digital breast
tomosynthesis was performed. The images were evaluated with
computer-aided detection.

[R MLO synth-2D]
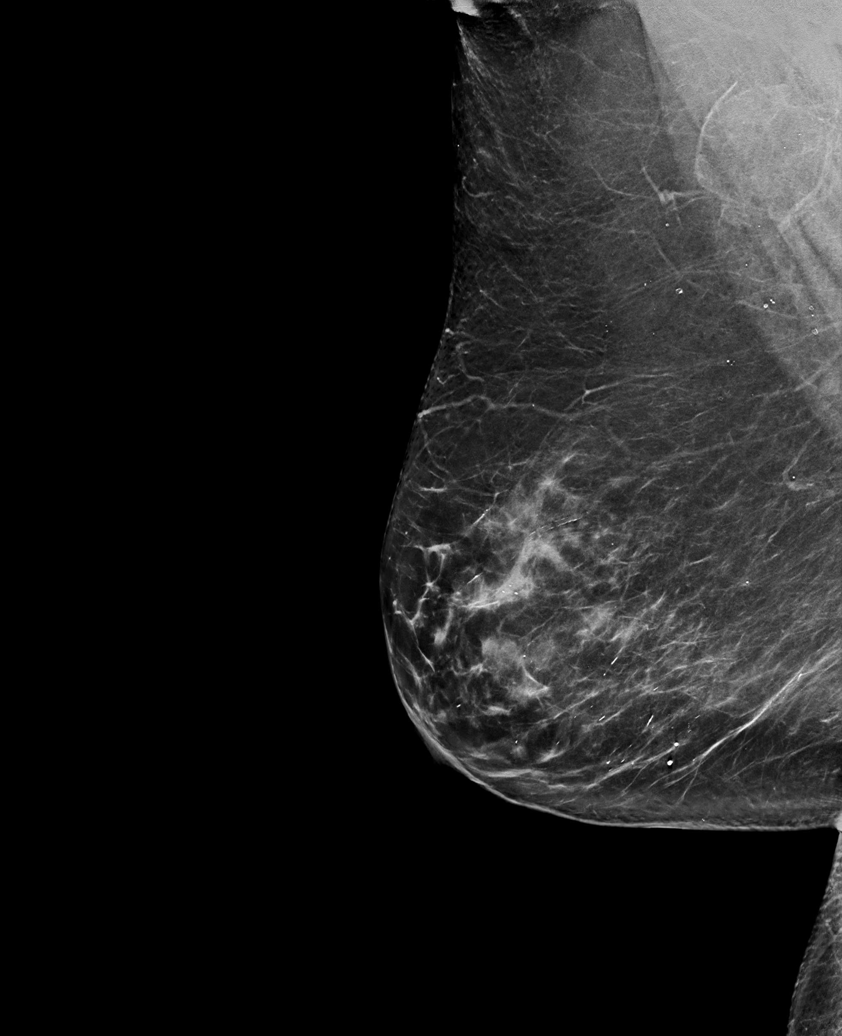

[L MLO synth-2D (1 of 2)]
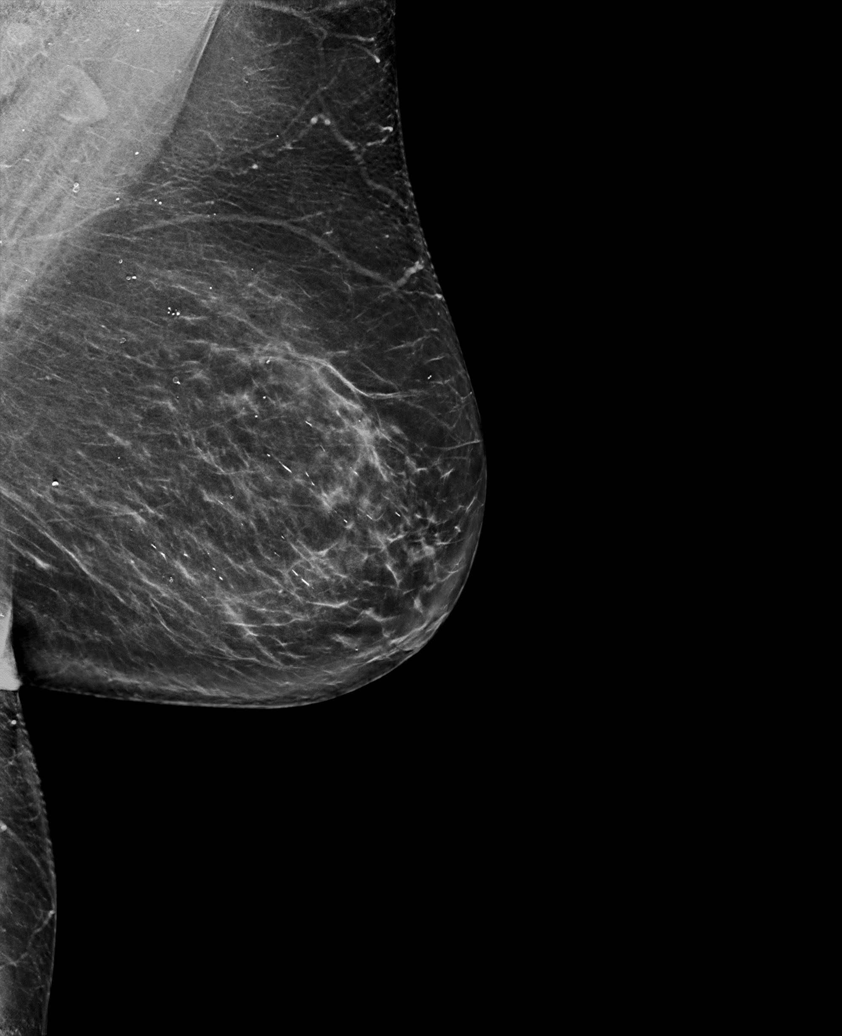

[L CC synth-2D]
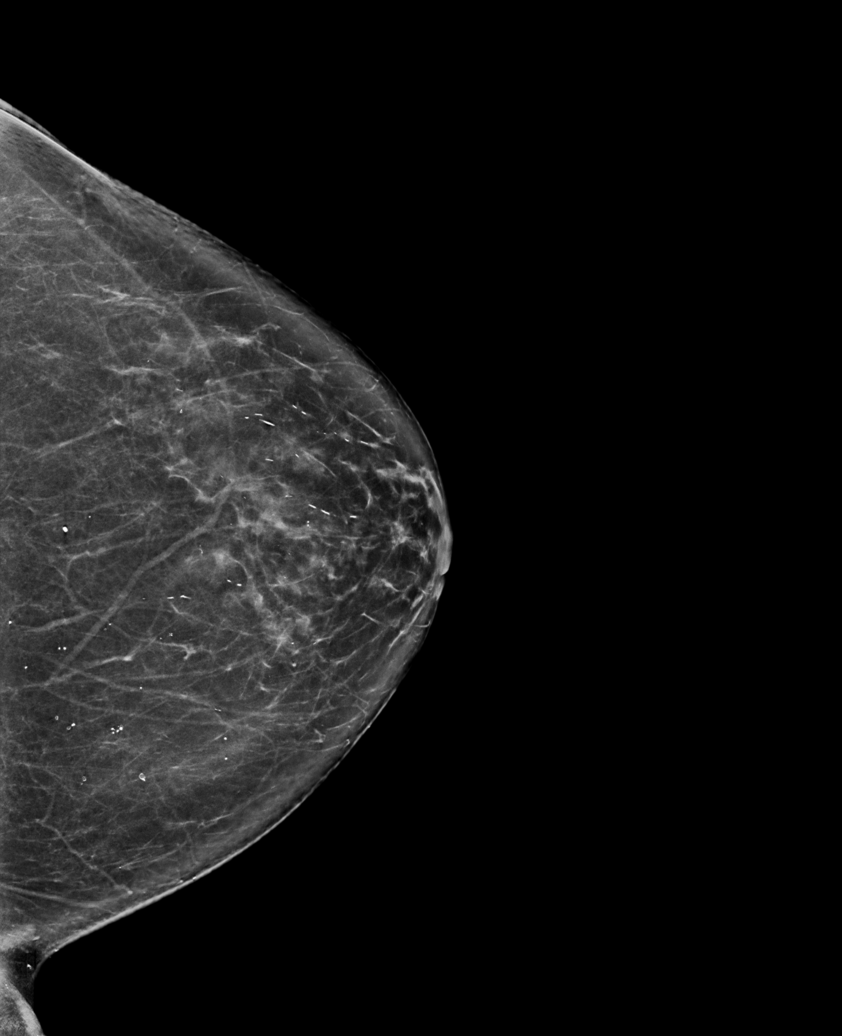

[L MLO synth-2D (2 of 2)]
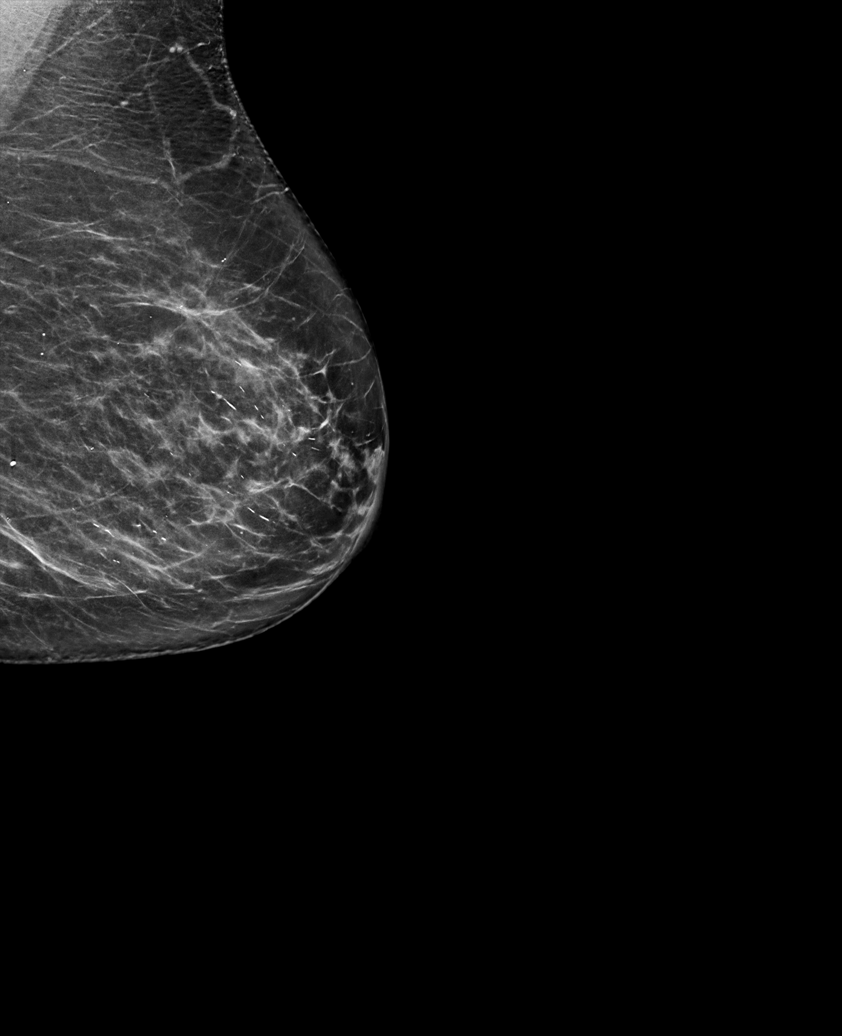

[R CC synth-2D]
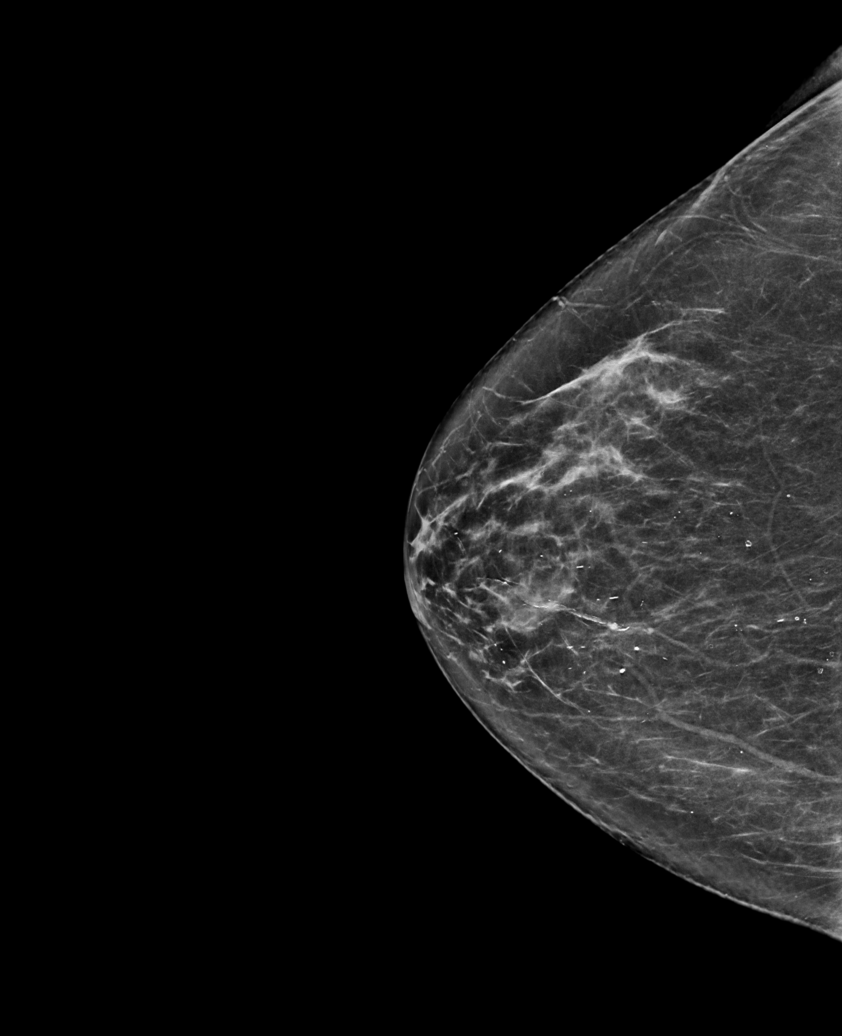

[L MLO tomo · tomo slice 42/83.0]
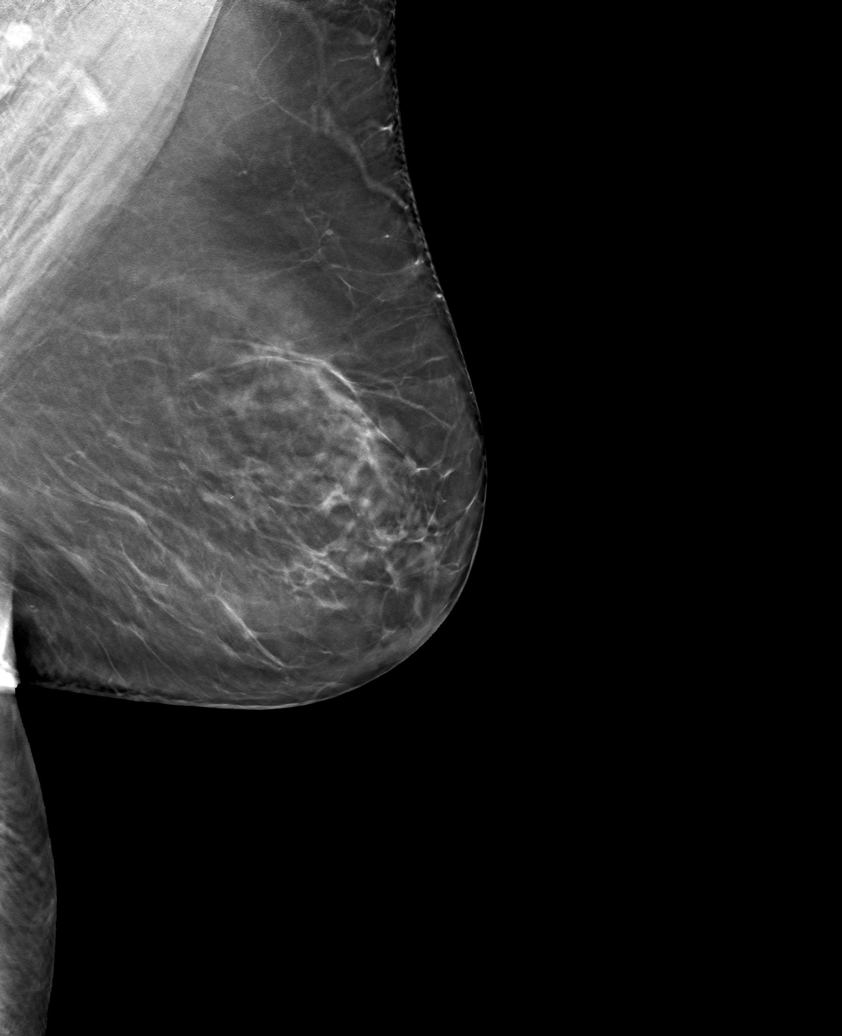

[6 of 30 positions shown; findings below may reference images not displayed]

ACR Breast Density Category b: There are scattered areas of
fibroglandular density.
FINDINGS: There are no findings suspicious for malignancy.
IMPRESSION: No mammographic evidence of malignancy. A result letter of this
screening mammogram will be mailed directly to the patient.

RECOMMENDATION:
Screening mammogram in one year. (Code:51-O-LD2)

BI-RADS CATEGORY  1: Negative.

## 2023-09-18 ENCOUNTER — Other Ambulatory Visit: Payer: Self-pay | Admitting: Internal Medicine

## 2023-09-18 DIAGNOSIS — Z1231 Encounter for screening mammogram for malignant neoplasm of breast: Secondary | ICD-10-CM

## 2023-10-31 ENCOUNTER — Ambulatory Visit
Admission: RE | Admit: 2023-10-31 | Discharge: 2023-10-31 | Disposition: A | Discharge status: Home / Self Care | Source: Ambulatory Visit | Attending: Internal Medicine | Admitting: Internal Medicine

## 2023-10-31 DIAGNOSIS — Z1231 Encounter for screening mammogram for malignant neoplasm of breast: Secondary | ICD-10-CM | POA: Diagnosis present

## 2024-01-02 ENCOUNTER — Ambulatory Visit (INDEPENDENT_AMBULATORY_CARE_PROVIDER_SITE_OTHER): Admitting: Podiatry

## 2024-01-02 ENCOUNTER — Encounter: Payer: Self-pay | Admitting: Podiatry

## 2024-01-02 VITALS — Ht 62.5 in | Wt 181.0 lb

## 2024-01-02 DIAGNOSIS — D2371 Other benign neoplasm of skin of right lower limb, including hip: Secondary | ICD-10-CM | POA: Diagnosis not present

## 2024-01-02 NOTE — Progress Notes (Signed)
   Chief Complaint  Patient presents with   Callouses    Pt is here due to corn on the bottom of her right foot states it has been there for years, usually can shave it off herself or when she gets a pedicure, had one over the weekend and feels a lot better, wants to know what can be recommend for it.    Subjective: 78 y.o. female presenting to the office today for evaluation of a symptomatic skin lesion to the plantar aspect of the right plantar forefoot   Past Medical History:  Diagnosis Date   Chronic lumbar pain    Depression    Diabetes mellitus without complication (HCC)    Hyperlipidemia    Hypertension    Laryngopharyngeal reflux     Past Surgical History:  Procedure Laterality Date   COLONOSCOPY WITH PROPOFOL  N/A 02/05/2016   Procedure: COLONOSCOPY WITH PROPOFOL ;  Surgeon: Gladis RAYMOND Mariner, MD;  Location[facility] ENDOSCOPY;  Service: Endoscopy;  Laterality: N/A;   COLONOSCOPY WITH PROPOFOL  N/A 08/09/2021   Procedure: COLONOSCOPY WITH PROPOFOL ;  Surgeon: Maryruth Ole DASEN, MD;  Location: [facility] ENDOSCOPY;  Service: Endoscopy;  Laterality: N/A;   ESOPHAGOGASTRODUODENOSCOPY (EGD) WITH PROPOFOL  N/A 08/09/2021   Procedure: ESOPHAGOGASTRODUODENOSCOPY (EGD) WITH PROPOFOL ;  Surgeon: Maryruth Ole DASEN, MD;  Location: [facility] ENDOSCOPY;  Service: Endoscopy;  Laterality: N/A;    Allergies  Allergen Reactions   Doxycycline Itching   Other Hives    Macadamia Nuts 'causes itching and hives'     Objective:  Physical Exam General: Alert and oriented x3 in no acute distress  Dermatology: Hyperkeratotic lesion(s) present on the plantar aspect of the fifth MTP right. Pain on palpation with a central nucleated core noted. Skin is warm, dry and supple bilateral lower extremities. Negative for open lesions or macerations.  Vascular: Palpable pedal pulses bilaterally. No edema or erythema noted. Capillary refill within normal limits.  Neurological: Grossly intact via light  touch  Musculoskeletal Exam: Pain on palpation at the keratotic lesion(s) noted. Range of motion within normal limits bilateral. Muscle strength 5/5 in all groups bilateral.  Assessment: 1.  Symptomatic benign skin lesion   Plan of Care:  -Patient evaluated -Excisional debridement of keratoic lesion(s) using a chisel blade was performed without incident.  -Salicylic acid applied with a bandaid -Return to the clinic PRN.   Thresa EMERSON Sar, DPM [facility]  Dr. Thresa EMERSON Sar, DPM    [address] [place], [place] [address]                Office [phone]  Fax [phone]

## 2024-04-19 ENCOUNTER — Other Ambulatory Visit: Payer: Self-pay

## 2024-04-19 ENCOUNTER — Encounter: Admission: EM | Disposition: A | Payer: Self-pay | Source: Home / Self Care

## 2024-04-19 ENCOUNTER — Observation Stay: Admitting: Anesthesiology

## 2024-04-19 ENCOUNTER — Emergency Department

## 2024-04-19 ENCOUNTER — Encounter: Payer: Self-pay | Admitting: Emergency Medicine

## 2024-04-19 ENCOUNTER — Observation Stay: Admission: EM | Admit: 2024-04-19 | Discharge: 2024-04-20 | Disposition: A

## 2024-04-19 DIAGNOSIS — K819 Cholecystitis, unspecified: Principal | ICD-10-CM

## 2024-04-19 DIAGNOSIS — K810 Acute cholecystitis: Secondary | ICD-10-CM | POA: Diagnosis present

## 2024-04-19 LAB — COMPREHENSIVE METABOLIC PANEL WITH GFR
ALT: 17 U/L (ref 0–44)
AST: 21 U/L (ref 15–41)
Albumin: 4.5 g/dL (ref 3.5–5.0)
Alkaline Phosphatase: 74 U/L (ref 38–126)
Anion gap: 14 (ref 5–15)
BUN: 24 mg/dL — ABNORMAL HIGH (ref 8–23)
CO2: 26 mmol/L (ref 22–32)
Calcium: 9.7 mg/dL (ref 8.9–10.3)
Chloride: 97 mmol/L — ABNORMAL LOW (ref 98–111)
Creatinine, Ser: 0.98 mg/dL (ref 0.44–1.00)
GFR, Estimated: 60 mL/min — ABNORMAL LOW (ref >60)
Glucose, Bld: 204 mg/dL — ABNORMAL HIGH (ref 70–99)
Potassium: 4.4 mmol/L (ref 3.5–5.1)
Sodium: 136 mmol/L (ref 135–145)
Total Bilirubin: 0.6 mg/dL (ref 0.0–1.2)
Total Protein: 7.6 g/dL (ref 6.5–8.1)

## 2024-04-19 LAB — URINALYSIS, ROUTINE W REFLEX MICROSCOPIC
Bilirubin Urine: NEGATIVE
Glucose, UA: 150 mg/dL — AB
Ketones, ur: NEGATIVE mg/dL
Leukocytes,Ua: NEGATIVE
Nitrite: NEGATIVE
Protein, ur: NEGATIVE mg/dL
Specific Gravity, Urine: 1.018 (ref 1.005–1.030)
pH: 6 (ref 5.0–8.0)

## 2024-04-19 LAB — CBC
HCT: 40.6 % (ref 36.0–46.0)
Hemoglobin: 13.7 g/dL (ref 12.0–15.0)
MCH: 30.6 pg (ref 26.0–34.0)
MCHC: 33.7 g/dL (ref 30.0–36.0)
MCV: 90.8 fL (ref 80.0–100.0)
Platelets: 219 K/uL (ref 150–400)
RBC: 4.47 MIL/uL (ref 3.87–5.11)
RDW: 13.2 % (ref 11.5–15.5)
WBC: 12.4 K/uL — ABNORMAL HIGH (ref 4.0–10.5)
nRBC: 0 % (ref 0.0–0.2)

## 2024-04-19 LAB — GLUCOSE, CAPILLARY: Glucose-Capillary: 230 mg/dL — ABNORMAL HIGH (ref 70–99)

## 2024-04-19 LAB — LIPASE, BLOOD: Lipase: 27 U/L (ref 11–51)

## 2024-04-19 SURGERY — CHOLECYSTECTOMY, ROBOT-ASSISTED, LAPAROSCOPIC
Anesthesia: General

## 2024-04-19 MED ORDER — PIPERACILLIN-TAZOBACTAM 3.375 G IVPB
3.3750 g | Freq: Three times a day (TID) | INTRAVENOUS | Status: DC
  Administered 2024-04-19 – 2024-04-20 (×2): 3.375 g via INTRAVENOUS
  Filled 2024-04-19: qty 50

## 2024-04-19 MED ORDER — SODIUM CHLORIDE 0.9 % IV SOLN: INTRAVENOUS | Status: DC

## 2024-04-19 MED ORDER — PHENYLEPHRINE 80 MCG/ML (10ML) SYRINGE FOR IV PUSH (FOR BLOOD PRESSURE SUPPORT)
PREFILLED_SYRINGE | INTRAVENOUS | Status: DC | PRN
  Administered 2024-04-19: 80 ug via INTRAVENOUS
  Administered 2024-04-19: 120 ug via INTRAVENOUS
  Administered 2024-04-19: 80 ug via INTRAVENOUS

## 2024-04-19 MED ORDER — TELMISARTAN-HCTZ 80-12.5 MG PO TABS: 1.0000 | ORAL_TABLET | Freq: Every day | ORAL | Status: DC

## 2024-04-19 MED ORDER — ROCURONIUM BROMIDE 100 MG/10ML IV SOLN
INTRAVENOUS | Status: DC | PRN
  Administered 2024-04-19: 50 mg via INTRAVENOUS
  Administered 2024-04-19: 20 mg via INTRAVENOUS

## 2024-04-19 MED ORDER — LIDOCAINE HCL (CARDIAC) PF 100 MG/5ML IV SOSY
PREFILLED_SYRINGE | INTRAVENOUS | Status: DC | PRN
  Administered 2024-04-19: 80 mg via INTRAVENOUS

## 2024-04-19 MED ORDER — PANTOPRAZOLE SODIUM 40 MG IV SOLR
40.0000 mg | Freq: Once | INTRAVENOUS | Status: AC
  Administered 2024-04-19: 40 mg via INTRAVENOUS
  Filled 2024-04-19: qty 10

## 2024-04-19 MED ORDER — HYDROCODONE-ACETAMINOPHEN 5-325 MG PO TABS
1.0000 | ORAL_TABLET | ORAL | Status: DC | PRN
  Administered 2024-04-20: 1 via ORAL
  Filled 2024-04-19: qty 2

## 2024-04-19 MED ORDER — LIDOCAINE VISCOUS HCL 2 % MT SOLN
15.0000 mL | Freq: Once | OROMUCOSAL | Status: AC
  Administered 2024-04-19: 15 mL via ORAL
  Filled 2024-04-19: qty 15

## 2024-04-19 MED ORDER — ONDANSETRON 4 MG PO TBDP: 4.0000 mg | ORAL_TABLET | Freq: Four times a day (QID) | ORAL | Status: DC | PRN

## 2024-04-19 MED ORDER — INDOCYANINE GREEN 25 MG IJ SOLR
1.2500 mg | Freq: Once | INTRAMUSCULAR | Status: AC
  Administered 2024-04-19: 2.5 mg via INTRAVENOUS
  Filled 2024-04-19: qty 10

## 2024-04-19 MED ORDER — OXYCODONE HCL 5 MG PO TABS: 5.0000 mg | ORAL_TABLET | Freq: Once | ORAL | Status: DC | PRN

## 2024-04-19 MED ORDER — ROCURONIUM BROMIDE 10 MG/ML (PF) SYRINGE
PREFILLED_SYRINGE | INTRAVENOUS | Status: AC
  Filled 2024-04-19: qty 10

## 2024-04-19 MED ORDER — HYDROCHLOROTHIAZIDE 12.5 MG PO TABS: 12.5000 mg | ORAL_TABLET | Freq: Every day | ORAL | Status: DC

## 2024-04-19 MED ORDER — ALUM & MAG HYDROXIDE-SIMETH 200-200-20 MG/5ML PO SUSP
30.0000 mL | Freq: Once | ORAL | Status: AC
  Administered 2024-04-19: 30 mL via ORAL
  Filled 2024-04-19: qty 30

## 2024-04-19 MED ORDER — DEXMEDETOMIDINE HCL IN NACL 80 MCG/20ML IV SOLN
INTRAVENOUS | Status: DC | PRN
  Administered 2024-04-19: 8 ug via INTRAVENOUS

## 2024-04-19 MED ORDER — LIDOCAINE HCL (PF) 2 % IJ SOLN
INTRAMUSCULAR | Status: AC
  Filled 2024-04-19: qty 5

## 2024-04-19 MED ORDER — ONDANSETRON HCL 4 MG/2ML IJ SOLN: 4.0000 mg | Freq: Four times a day (QID) | INTRAMUSCULAR | Status: DC | PRN

## 2024-04-19 MED ORDER — ONDANSETRON HCL 4 MG/2ML IJ SOLN
INTRAMUSCULAR | Status: DC | PRN
  Administered 2024-04-19: 4 mg via INTRAVENOUS

## 2024-04-19 MED ORDER — DEXAMETHASONE SOD PHOSPHATE PF 10 MG/ML IJ SOLN
INTRAMUSCULAR | Status: DC | PRN
  Administered 2024-04-19: 10 mg via INTRAVENOUS

## 2024-04-19 MED ORDER — FENTANYL CITRATE (PF) 100 MCG/2ML IJ SOLN
INTRAMUSCULAR | Status: AC
  Filled 2024-04-19: qty 2

## 2024-04-19 MED ORDER — IOHEXOL 350 MG/ML SOLN
100.0000 mL | Freq: Once | INTRAVENOUS | Status: AC | PRN
  Administered 2024-04-19: 100 mL via INTRAVENOUS

## 2024-04-19 MED ORDER — ACETAMINOPHEN 325 MG PO TABS: 650.0000 mg | ORAL_TABLET | Freq: Four times a day (QID) | ORAL | Status: DC | PRN

## 2024-04-19 MED ORDER — EPHEDRINE SULFATE (PRESSORS) 25 MG/5ML IV SOSY
PREFILLED_SYRINGE | INTRAVENOUS | Status: DC | PRN
  Administered 2024-04-19: 10 mg via INTRAVENOUS

## 2024-04-19 MED ORDER — OXYCODONE HCL 5 MG/5ML PO SOLN: 5.0000 mg | Freq: Once | ORAL | Status: DC | PRN

## 2024-04-19 MED ORDER — BUPIVACAINE-EPINEPHRINE (PF) 0.25% -1:200000 IJ SOLN
INTRAMUSCULAR | Status: AC
  Filled 2024-04-19: qty 30

## 2024-04-19 MED ORDER — SODIUM CHLORIDE 0.9 % IR SOLN
Status: DC | PRN
  Administered 2024-04-19: 1000 mL

## 2024-04-19 MED ORDER — PROPOFOL 10 MG/ML IV BOLUS
INTRAVENOUS | Status: DC | PRN
  Administered 2024-04-19: 100 mg via INTRAVENOUS
  Administered 2024-04-19: 50 mg via INTRAVENOUS

## 2024-04-19 MED ORDER — IRBESARTAN 150 MG PO TABS: 300.0000 mg | ORAL_TABLET | Freq: Every day | ORAL | Status: DC

## 2024-04-19 MED ORDER — SUGAMMADEX SODIUM 200 MG/2ML IV SOLN
INTRAVENOUS | Status: DC | PRN
  Administered 2024-04-19: 200 mg via INTRAVENOUS

## 2024-04-19 MED ORDER — INSULIN ASPART 100 UNIT/ML IJ SOLN
0.0000 [IU] | Freq: Three times a day (TID) | INTRAMUSCULAR | Status: DC
  Administered 2024-04-20: 5 [IU] via SUBCUTANEOUS
  Filled 2024-04-19: qty 5

## 2024-04-19 MED ORDER — INDOCYANINE GREEN 25 MG IJ SOLR
1.2500 mg | Freq: Once | INTRAMUSCULAR | Status: DC
  Filled 2024-04-19: qty 10

## 2024-04-19 MED ORDER — FENTANYL CITRATE (PF) 100 MCG/2ML IJ SOLN: 25.0000 ug | INTRAMUSCULAR | Status: DC | PRN

## 2024-04-19 MED ORDER — PIPERACILLIN-TAZOBACTAM 3.375 G IVPB
INTRAVENOUS | Status: AC
  Filled 2024-04-19: qty 50

## 2024-04-19 MED ORDER — VASOPRESSIN 20 UNIT/ML IV SOLN
INTRAVENOUS | Status: DC | PRN
  Administered 2024-04-19: 2 [IU] via INTRAVENOUS

## 2024-04-19 MED ORDER — FENTANYL CITRATE (PF) 100 MCG/2ML IJ SOLN
INTRAMUSCULAR | Status: DC | PRN
  Administered 2024-04-19 (×2): 50 ug via INTRAVENOUS

## 2024-04-19 MED ORDER — ENOXAPARIN SODIUM 40 MG/0.4ML IJ SOSY
40.0000 mg | PREFILLED_SYRINGE | Freq: Every day | INTRAMUSCULAR | Status: DC
  Administered 2024-04-19: 40 mg via SUBCUTANEOUS

## 2024-04-19 MED ORDER — ACETAMINOPHEN 650 MG RE SUPP: 650.0000 mg | Freq: Four times a day (QID) | RECTAL | Status: DC | PRN

## 2024-04-19 MED ORDER — ESCITALOPRAM OXALATE 10 MG PO TABS: 10.0000 mg | ORAL_TABLET | Freq: Every day | ORAL | Status: DC

## 2024-04-19 MED ORDER — HYDROMORPHONE HCL 1 MG/ML IJ SOLN
INTRAMUSCULAR | Status: AC
  Filled 2024-04-19: qty 1

## 2024-04-19 MED ORDER — PANTOPRAZOLE SODIUM 40 MG PO TBEC: 40.0000 mg | DELAYED_RELEASE_TABLET | Freq: Every day | ORAL | Status: DC

## 2024-04-19 MED ORDER — MORPHINE SULFATE (PF) 4 MG/ML IV SOLN
4.0000 mg | INTRAVENOUS | Status: DC | PRN
  Administered 2024-04-19 – 2024-04-20 (×2): 4 mg via INTRAVENOUS
  Filled 2024-04-19 (×2): qty 1

## 2024-04-19 MED ORDER — SUCCINYLCHOLINE CHLORIDE 200 MG/10ML IV SOSY
PREFILLED_SYRINGE | INTRAVENOUS | Status: AC
  Filled 2024-04-19: qty 10

## 2024-04-19 MED ORDER — ACETAMINOPHEN 500 MG PO TABS
1000.0000 mg | ORAL_TABLET | Freq: Once | ORAL | Status: AC
  Administered 2024-04-19: 1000 mg via ORAL
  Filled 2024-04-19: qty 2

## 2024-04-19 MED ORDER — VISTASEAL 10 ML SINGLE DOSE KIT
PACK | CUTANEOUS | Status: AC
  Filled 2024-04-19: qty 10

## 2024-04-19 MED ORDER — HYDROMORPHONE HCL 1 MG/ML IJ SOLN
INTRAMUSCULAR | Status: DC | PRN
  Administered 2024-04-19: 1 mg via INTRAVENOUS

## 2024-04-19 MED ADMIN — Sodium Chloride Irrigation Soln 0.9%: 1000 mL | NDC 99999050048

## 2024-04-19 MED ADMIN — Bupivacaine Inj 0.25% w/ Epinephrine 1:200000: 30 mL | NDC 63323046157

## 2024-04-19 SURGICAL SUPPLY — 42 items
APPLICATOR VISTASEAL 35 (MISCELLANEOUS) IMPLANT
APPLICATOR VISTASEAL FLEXIBLE (MISCELLANEOUS) IMPLANT
BAG PRESSURE INF REUSE 1000 (BAG) IMPLANT
CANNULA REDUCER 12-8 DVNC XI (CANNULA) ×1 IMPLANT
CAUTERY HOOK MNPLR 1.6 DVNC XI (INSTRUMENTS) ×1 IMPLANT
CLIP LIGATING HEM O LOK PURPLE (MISCELLANEOUS) IMPLANT
CLIP LIGATING HEMO O LOK GREEN (MISCELLANEOUS) ×1 IMPLANT
DEFOGGER SCOPE WARM SEASHARP (MISCELLANEOUS) ×1 IMPLANT
DERMABOND ADVANCED .7 DNX12 (GAUZE/BANDAGES/DRESSINGS) ×1 IMPLANT
DRAPE ARM DVNC X/XI (DISPOSABLE) ×4 IMPLANT
DRAPE C-ARM XRAY 36X54 (DRAPES) IMPLANT
DRAPE COLUMN DVNC XI (DISPOSABLE) ×1 IMPLANT
ELECTRODE REM PT RTRN 9FT ADLT (ELECTROSURGICAL) ×1 IMPLANT
FORCEPS BPLR 8 MD DVNC XI (FORCEP) IMPLANT
FORCEPS BPLR FENES DVNC XI (FORCEP) ×1 IMPLANT
FORCEPS PROGRASP DVNC XI (FORCEP) ×1 IMPLANT
GLOVE BIO SURGEON STRL SZ 6.5 (GLOVE) ×2 IMPLANT
GLOVE BIOGEL PI IND STRL 6.5 (GLOVE) ×2 IMPLANT
GLOVE SURG SYN 6.5 PF PI (GLOVE) ×2 IMPLANT
GOWN STRL REUS W/ TWL LRG LVL3 (GOWN DISPOSABLE) ×4 IMPLANT
GRASPER SUT TROCAR 14GX15 (MISCELLANEOUS) ×1 IMPLANT
HEMOSTAT SURGICEL 2X3 (HEMOSTASIS) IMPLANT
IRRIGATOR SUCT 8 DISP DVNC XI (IRRIGATION / IRRIGATOR) IMPLANT
IV 0.9% NACL 1000 ML (IV SOLUTION) IMPLANT
IV CATH ANGIO 12GX3 LT BLUE (NEEDLE) IMPLANT
KIT PINK PAD W/HEAD ARM REST (MISCELLANEOUS) ×1 IMPLANT
LABEL OR SOLS (LABEL) ×1 IMPLANT
MANIFOLD NEPTUNE II (INSTRUMENTS) IMPLANT
NDL HYPO 22X1.5 SAFETY MO (MISCELLANEOUS) ×1 IMPLANT
NDL INSUFFLATION 14GA 120MM (NEEDLE) ×1 IMPLANT
NS IRRIG 500ML POUR BTL (IV SOLUTION) ×1 IMPLANT
OBTURATOR OPTICALSTD 8 DVNC (TROCAR) ×1 IMPLANT
PACK LAP CHOLECYSTECTOMY (MISCELLANEOUS) ×1 IMPLANT
SEAL UNIV 5-12 XI (MISCELLANEOUS) ×4 IMPLANT
SET TUBE SMOKE EVAC HIGH FLOW (TUBING) ×1 IMPLANT
SOLN STERILE WATER 500 ML (IV SOLUTION) ×1 IMPLANT
SOLUTION ELECTROSURG ANTI STCK (MISCELLANEOUS) ×1 IMPLANT
SPIKE FLUID TRANSFER (MISCELLANEOUS) ×2 IMPLANT
SPONGE T-LAP 4X18 ~~LOC~~+RFID (SPONGE) IMPLANT
SUT VICRYL 0 UR6 27IN ABS (SUTURE) ×1 IMPLANT
SUTURE MNCRL 4-0 27XMF (SUTURE) ×1 IMPLANT
SYSTEM BAG RETRIEVAL 10MM (BASKET) ×1 IMPLANT

## 2024-04-19 NOTE — Anesthesia Procedure Notes (Signed)
 Procedure Name: Intubation Date/Time: 04/19/2024 7:01 PM  Performed by: Landy Francena BIRCH, CRNAPre-anesthesia Checklist: Patient identified, Emergency Drugs available, Suction available and Patient being monitored Patient Re-evaluated:Patient Re-evaluated prior to induction Oxygen Delivery Method: Circle system utilized Preoxygenation: Pre-oxygenation with 100% oxygen Induction Type: IV induction Ventilation: Mask ventilation without difficulty Laryngoscope Size: McGrath and 3 Grade View: Grade I Tube type: Oral Tube size: 7.0 mm Number of attempts: 1 Airway Equipment and Method: Stylet, Oral airway and Bite block Placement Confirmation: ETT inserted through vocal cords under direct vision, positive ETCO2 and breath sounds checked- equal and bilateral Secured at: 21 cm Tube secured with: Tape Dental Injury: Teeth and Oropharynx as per pre-operative assessment

## 2024-04-19 NOTE — H&P (Signed)
 SURGICAL HISTORY AND PHYSICAL NOTE   HISTORY OF PRESENT ILLNESS (HPI):  78 y.o. female presented to [facility] ED for evaluation of abdominal pain. Patient reports she started having abdominal pain since yesterday night.  Pain localized to the right upper quadrant.  Pain radiated to the back between the shoulder blades.  Patient cannot identify any aggravating or aggravating factors.  She denies any fever.  At the ER she was found with stable vital signs.  On physical exam she was tender to palpation in the right upper quadrant.  Labs shows mild leukocytosis, no significant electrolyte disturbance.  Bilirubin and liver enzyme within normal limits.  She had a CT scan of the abdomen pelvis does show severe pericholecystic edema and stranding.  I personally evaluated the images.  Surgery is consulted by Dr. Clarine in this context for evaluation and management of acute cholecystitis.  PAST MEDICAL HISTORY (PMH):  Past Medical History:  Diagnosis Date   Chronic lumbar pain    Depression    Diabetes mellitus without complication (HCC)    Hyperlipidemia    Hypertension    Laryngopharyngeal reflux      PAST SURGICAL HISTORY (PSH):  Past Surgical History:  Procedure Laterality Date   COLONOSCOPY WITH PROPOFOL  N/A 02/05/2016   Procedure: COLONOSCOPY WITH PROPOFOL ;  Surgeon: Gladis RAYMOND Mariner, MD;  Location: [facility] ENDOSCOPY;  Service: Endoscopy;  Laterality: N/A;   COLONOSCOPY WITH PROPOFOL  N/A 08/09/2021   Procedure: COLONOSCOPY WITH PROPOFOL ;  Surgeon: Maryruth Ole DASEN, MD;  Location: [facility] ENDOSCOPY;  Service: Endoscopy;  Laterality: N/A;   ESOPHAGOGASTRODUODENOSCOPY (EGD) WITH PROPOFOL  N/A 08/09/2021   Procedure: ESOPHAGOGASTRODUODENOSCOPY (EGD) WITH PROPOFOL ;  Surgeon: Maryruth Ole DASEN, MD;  Location: [facility] ENDOSCOPY;  Service: Endoscopy;  Laterality: N/A;     MEDICATIONS:  Prior to Admission medications  Medication Sig Start Date End Date Taking? Authorizing Provider  diclofenac (VOLTAREN) 75 MG  EC tablet Take 75 mg by mouth 2 (two) times daily. 12/27/23  Yes [provider]  glipiZIDE (GLUCOTROL XL) 5 MG 24 hr tablet Take 5 mg by mouth daily with breakfast. 03/22/24 03/22/25 Yes [provider]  aspirin EC 81 MG tablet Take 81 mg by mouth daily.    [provider]  Cholecalciferol 125 MCG (5000 UT) capsule Take 5,000 Units by mouth daily.    [provider]  escitalopram (LEXAPRO) 10 MG tablet Take 10 mg by mouth daily.    [provider]  hydrochlorothiazide (HYDRODIURIL) 25 MG tablet Take 25 mg by mouth daily.    [provider]  metFORMIN (GLUCOPHAGE) 500 MG tablet Take 500 mg by mouth daily with breakfast.    [provider]  pantoprazole (PROTONIX) 40 MG tablet Take 40 mg by mouth daily.    [provider]  ranitidine (ZANTAC) 150 MG capsule Take 150 mg by mouth 2 (two) times daily.    [provider]  simvastatin (ZOCOR) 10 MG tablet Take 10 mg by mouth daily.    [provider]  telmisartan-hydrochlorothiazide (MICARDIS HCT) 80-12.5 MG tablet Take 1 tablet by mouth daily.    [provider]  vitamin E 400 UNIT capsule Take 400 Units by mouth daily.    [provider]     ALLERGIES:  Allergies[1]   SOCIAL HISTORY:  Social History   Socioeconomic History   Marital status: Married    Spouse name: Not on file   Number of children: Not on file   Years of education: Not on file   Highest  education level: Not on file  Occupational History   Not on file  Tobacco Use   Smoking status: Never   Smokeless tobacco: Never  Substance and Sexual Activity   Alcohol use: No   Drug use: No   Sexual activity: Not on file  Other Topics Concern   Not on file  Social History Narrative   Not on file   Social Drivers of Health   Tobacco Use: Low Risk (04/19/2024)   Patient History    Smoking Tobacco Use: Never    Smokeless Tobacco Use: Never    Passive Exposure: Not on file   Financial Resource Strain: Low Risk  (09/19/2023)   Received from [facility]   Overall Financial Resource Strain (CARDIA)    Difficulty of Paying Living Expenses: Not hard at all  Food Insecurity: No Food Insecurity (09/19/2023)   Received from [facility]   Epic    Within the past 12 months, you worried that your food would run out before you got the money to buy more.: Never true    Within the past 12 months, the food you bought just didn't last and you didn't have money to get more.: Never true  Transportation Needs: No Transportation Needs (09/19/2023)   Received from [facility] - Transportation    In the past 12 months, has lack of transportation kept you from medical appointments or from getting medications?: No    Lack of Transportation (Non-Medical): No  Physical Activity: Not on file  Stress: Not on file  Social Connections: Not on file  Intimate Partner Violence: Not on file  Depression (EYV7-0): Not on file  Alcohol Screen: Not on file  Housing: Low Risk  (09/19/2023)   Received from [facility]   Epic    In the last 12 months, was there a time when you were not able to pay the mortgage or rent on time?: No    In the past 12 months, how many times have you moved where you were living?: 0    At any time in the past 12 months, were you homeless or living in a shelter (including now)?: No  Utilities: Not At Risk (09/19/2023)   Received from [facility] Utilities    Threatened with loss of utilities: No  Health Literacy: Not on file     FAMILY HISTORY:  Family History  Problem Relation Age of Onset   Breast cancer Paternal Aunt    Osteoporosis Mother    Heart attack Father    Colon polyps Sister    Endometrial cancer Sister    Colon cancer Brother      REVIEW OF SYSTEMS:  Constitutional: denies weight loss, fever, chills, or sweats  Eyes: denies any other  vision changes, history of eye injury  ENT: denies sore throat, hearing problems  Respiratory: denies shortness of breath, wheezing  Cardiovascular: denies chest pain, palpitations  Gastrointestinal: positive abdominal pain Genitourinary: denies burning with urination or urinary frequency Musculoskeletal: denies any other joint pains or cramps  Skin: denies any other rashes or skin discolorations  Neurological: denies any other headache, dizziness, weakness  Psychiatric: denies any other depression, anxiety   All other review of systems were negative   VITAL SIGNS:  Temp:  [98.1 F (36.7 C)] 98.1 F (36.7 C) (12/12 1252) Pulse Rate:  [90] 90 (12/12 1252) Resp:  [17] 17 (12/12 1252) BP: (157)/(73) 157/73 (12/12  1252) SpO2:  [98 %] 98 % (12/12 1252) Weight:  [77.1 kg-82.1 kg] 77.1 kg (12/12 1253)     Height: 5' 1 (154.9 cm) Weight: 77.1 kg BMI (Calculated): 32.14   INTAKE/OUTPUT:  This shift: No intake/output data recorded.  Last 2 shifts: @IOLAST2SHIFTS @   PHYSICAL EXAM:  Constitutional:  -- Normal body habitus  -- Awake, alert, and oriented x3  Eyes:  -- Pupils equally round and reactive to light  -- No scleral icterus  Ear, nose, and throat:  -- No jugular venous distension  Pulmonary:  -- No crackles  -- Equal breath sounds bilaterally -- Breathing non-labored at rest Cardiovascular:  -- S1, S2 present  -- No pericardial rubs Gastrointestinal:  -- Abdomen soft, tender in right upper quadrant, non-distended, no guarding or rebound tenderness  Musculoskeletal and Integumentary:  -- Wounds: None appreciated -- Extremities: B/L UE and LE FROM, hands and feet warm, no edema  Neurologic:  -- Motor function: intact and symmetric -- Sensation: intact and symmetric   Labs:     Latest Ref Rng & Units 04/19/2024   12:57 PM  CBC  WBC 4.0 - 10.5 K/uL 12.4   Hemoglobin 12.0 - 15.0 g/dL 86.2   Hematocrit 63.9 - 46.0 % 40.6   Platelets 150 - 400 K/uL 219        Latest Ref Rng & Units 04/19/2024   12:57 PM  CMP  Glucose 70 - 99 mg/dL 795   BUN 8 - 23 mg/dL 24   Creatinine 9.55 - 1.00 mg/dL 9.01   Sodium 864 - 854 mmol/L 136   Potassium 3.5 - 5.1 mmol/L 4.4   Chloride 98 - 111 mmol/L 97   CO2 22 - 32 mmol/L 26   Calcium 8.9 - 10.3 mg/dL 9.7   Total Protein 6.5 - 8.1 g/dL 7.6   Total Bilirubin 0.0 - 1.2 mg/dL 0.6   Alkaline Phos 38 - 126 U/L 74   AST 15 - 41 U/L 21   ALT 0 - 44 U/L 17      Imaging studies:  EXAM: CTA ABDOMEN AND PELVIS WITH CONTRAST 04/19/2024 04:25:46 PM   TECHNIQUE: CTA images of the abdomen and pelvis with intravenous contrast. 100 mL iohexol (OMNIPAQUE) 350 MG/ML injection. Three-dimensional MIP/volume rendered formations were performed. Automated exposure control, iterative reconstruction, and/or weight based adjustment of the mA/kV was utilized to reduce the radiation dose to as low as reasonably achievable.   COMPARISON: None available.   CLINICAL HISTORY: Diffuse abdominal pain worse on right side, report of ?melena. Eval for evidence of abdominal pathology, GI bleed.   FINDINGS:   VASCULATURE: GI BLEED: No active gastrointestinal bleeding identified.   AORTA: No acute finding. No abdominal aortic aneurysm. No dissection.   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 There is mild postanotic dilatation of the celiac artery measuring up to 9 mm. Artery is patent.   SUPERIOR MESEMTRIC ARTERY: No acute finding. No occlusion or significant stenosis.   INFERIOR MESENTERIC ARTERY: No acute finding. No occlusion or significant stenosis.   RENAL ARTERIES: No acute finding. No occlusion or significant stenosis.   ILIAC ARTERIES: No acute finding. No occlusion or significant stenosis.   ABDOMEN/PELVIS: LOWER CHEST: Visualized portion of the lower chest demonstrates no acute abnormality.   LIVER: The liver is unremarkable.    GALLBLADDER AND BILE DUCTS: There is diffuse gallbladder wall thickening with mild surrounding inflammation. No calcified gallstones are seen. There is  no biliary ductal dilatation.   SPLEEN: The spleen is unremarkable.   PANCREAS: The pancreas is unremarkable.   ADRENAL GLANDS: Bilateral adrenal glands demonstrate no acute abnormality.   KIDNEYS, URETERS AND BLADDER: No stones in the kidneys or ureters. No hydronephrosis. No perinephric or periureteral stranding. Urinary bladder is unremarkable.   GI AND BOWEL: Stomach and duodenal sweep demonstrate no acute abnormality. There is diffuse colonic diverticulosis. The appendix appears normal. There is no bowel obstruction. No abnormal bowel wall thickening or distension.   REPRODUCTIVE: Reproductive organs are unremarkable.   PERITONEUM AND RETROPERITONEUM: No ascites or free air.   LYMPH NODES: No lymphadenopathy.   BONES AND SOFT TISSUES: Degenerative changes affect the spine. There is moderate compression deformity of T12 and prominent Schmorl node along with superior endplate of L4. There is trace compression deformity superior endplate of L3. These are favored as chronic, but age indeterminate. The bones are diffusely osteopenic. No acute soft tissue abnormality.   IMPRESSION: 1. Diffuse gallbladder wall thickening with mild surrounding inflammation most compatible with acute cholecystitis. Please correlate clinically. 2. No active GI bleeding. 3. 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 4. Diffuse colonic diverticulosis without evidence of diverticulitis.   Electronically signed by: Greig Pique MD 04/19/2024 05:34 PM EST RP Workstation: [id number]  Assessment/Plan:  78 y.o. female with acute cholecystitis, complicated by pertinent comorbidities including hypertension, diabetes.  Patient  with history, physical exam and images consistent with acute cholecystitis. Patient oriented about diagnosis and surgical management as treatment.   Discussed the risk of surgery including post-op infxn, seroma, biloma, chronic pain, poor-delayed wound healing, retained gallstone, conversion to open procedure, post-op SBO or ileus, and need for additional procedures to address said risks.  The risks of general anesthetic including MI, CVA, sudden death or even reaction to anesthetic medications also discussed. Alternatives include continued observation.  Benefits include possible symptom relief, prevention of complications including acute cholecystitis, pancreatitis.  Lucas Petrin, MD     [1]  Allergies Allergen Reactions   Doxycycline Itching   Other Hives    Macadamia Nuts 'causes itching and hives'

## 2024-04-19 NOTE — ED Notes (Signed)
 See triage note Presents with some right sided abd pain   States shs has had some GB issues in past   Also noticed some tarry stools over the past 1-2 days  No n/v

## 2024-04-19 NOTE — ED Provider Notes (Signed)
 [facility] Provider Note    Event Date/Time   First MD Initiated Contact with Patient 04/19/24 1415     (approximate)   History   Abdominal Pain  C/O abd pain x 1 day. Tarry stools x 2 days.  Pain goes to back.. Also c/o bloating.  REferred to ED from [facility] for evaluation.  AAOx3.  Skin warm and dry. NAD   HPI Patty Robinson is a 78 y.o. female PMH hypertension, hyperlipidemia, diabetes presents for evaluation of abdominal pain, dark stools - Patient has been having moderate abdominal pain, diffusely worse on the right side and radiating toward her back since yesterday evening.  Has also been noticing tarry stools for the past 2 days.  No abdominal surgical history.  Went to clinic but was sent to ED for eval instead. - Pain is currently 4/10, diffuse but worse on the right - Confirms black stools for the past 2 days, not on iron - No vomiting       Physical Exam   Triage Vital Signs: ED Triage Vitals  Encounter Vitals Group     BP 04/19/24 1252 (!) 157/73     Girls Systolic BP Percentile --      Girls Diastolic BP Percentile --      Boys Systolic BP Percentile --      Boys Diastolic BP Percentile --      Pulse Rate 04/19/24 1252 90     Resp 04/19/24 1252 17     Temp 04/19/24 1252 98.1 F (36.7 C)     Temp Source 04/19/24 1252 Oral     SpO2 04/19/24 1252 98 %     Weight 04/19/24 1252 181 lb (82.1 kg)     Height 04/19/24 1253 5' 1 (1.549 m)     Head Circumference --      Peak Flow --      Pain Score 04/19/24 1252 4     Pain Loc --      Pain Education --      Exclude from Growth Chart --     Most recent vital signs: Vitals:   04/19/24 1252  BP: (!) 157/73  Pulse: 90  Resp: 17  Temp: 98.1 F (36.7 C)  SpO2: 98%     General: Awake, no distress.  CV:  Good peripheral perfusion. RRR, RP 2+ Resp:  Normal effort. CTAB Abd:  No distention.  Moderate tenderness to palpation throughout but worse in right middle abdomen, no significant  CVAT    ED Results / Procedures / Treatments   Labs (all labs ordered are listed, but only abnormal results are displayed) Labs Reviewed  COMPREHENSIVE METABOLIC PANEL WITH GFR - Abnormal; Notable for the following components:      Result Value   Chloride 97 (*)    Glucose, Bld 204 (*)    BUN 24 (*)    GFR, Estimated 60 (*)    All other components within normal limits  CBC - Abnormal; Notable for the following components:   WBC 12.4 (*)    All other components within normal limits  URINALYSIS, ROUTINE W REFLEX MICROSCOPIC - Abnormal; Notable for the following components:   Color, Urine YELLOW (*)    APPearance CLEAR (*)    Glucose, UA 150 (*)    Hgb urine dipstick SMALL (*)    Bacteria, UA MANY (*)    All other components within normal limits  LIPASE, BLOOD     EKG  Ecg =  sinus rhythm, rate 90, no gross ST elevation or depression, no significant repolarization abnormality with an isolated T wave inversion in lead III (nonspecific), normal axis, normal intervals.  No clear evidence of ischemia nor arrhythmia my interpretation.   RADIOLOGY CTA GI bleed protocol with findings concerning for cholecystitis on my interpretation and radiology reviewed.   PROCEDURES:  Critical Care performed: No  Procedures   MEDICATIONS ORDERED IN ED: Medications  escitalopram (LEXAPRO) tablet 10 mg (has no administration in time range)  telmisartan-hydrochlorothiazide (MICARDIS HCT) 80-12.5 MG per tablet 1 tablet (has no administration in time range)  famotidine (PEPCID) tablet 20 mg (has no administration in time range)  piperacillin-tazobactam (ZOSYN) IVPB 3.375 g (has no administration in time range)  enoxaparin (LOVENOX) injection 40 mg (has no administration in time range)  0.9 %  sodium chloride  infusion (has no administration in time range)  acetaminophen (TYLENOL) tablet 650 mg (has no administration in time range)    Or  acetaminophen (TYLENOL) suppository 650 mg (has no  administration in time range)  HYDROcodone-acetaminophen (NORCO/VICODIN) 5-325 MG per tablet 1-2 tablet (has no administration in time range)  morphine (PF) 4 MG/ML injection 4 mg (has no administration in time range)  ondansetron (ZOFRAN-ODT) disintegrating tablet 4 mg (has no administration in time range)    Or  ondansetron (ZOFRAN) injection 4 mg (has no administration in time range)  indocyanine green (IC-GREEN) injection 1.25 mg (has no administration in time range)  alum & mag hydroxide-simeth (MAALOX/MYLANTA) 200-200-20 MG/5ML suspension 30 mL (30 mLs Oral Given 04/19/24 1536)    And  lidocaine  (XYLOCAINE ) 2 % viscous mouth solution 15 mL (15 mLs Oral Given 04/19/24 1535)  pantoprazole (PROTONIX) injection 40 mg (40 mg Intravenous Given 04/19/24 1536)  acetaminophen (TYLENOL) tablet 1,000 mg (1,000 mg Oral Given 04/19/24 1536)  iohexol (OMNIPAQUE) 350 MG/ML injection 100 mL (100 mLs Intravenous Contrast Given 04/19/24 1618)     IMPRESSION / MDM / ASSESSMENT AND PLAN / ED COURSE  I reviewed the triage vital signs and the nursing notes.                              DDX/MDM/AP: Differential diagnosis includes, but is not limited to, dyspepsia/PUD, consider GI bleed, UTI, appendicitis, cholecystitis, atypical diverticulitis.  Do not suspect pelvic pathology at this time.  Doubt anginal equivalent, will screen with EKG.  Given age and abdominal discomfort will get CT imaging.  Plan: - Labs - Maalox, Protonix, Tylenol -rectal exam chaperoned by Rock RN, light brown stool, blood/melena, Hemoccult negative - CT angio - NPO, reassess  Patient's presentation is most consistent with acute presentation with potential threat to life or bodily function.  The patient is on the cardiac monitor to evaluate for evidence of arrhythmia and/or significant heart rate changes.  ED course below.  Workup concerning for cholecystitis.  General surgery consulted, will admit patient.  Clinical  Course as of 04/19/24 1841  Fri Apr 19, 2024  1447 CBC with mild leukocytosis, otherwise unremarkable [MM]  1447 CMP reviewed, overall unremarkable [MM]  1738 CTA AP: IMPRESSION: 1. Diffuse gallbladder wall thickening with mild surrounding inflammation most compatible with acute cholecystitis. Please correlate clinically. 2. No active GI bleeding. 3. 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 4. Diffuse colonic diverticulosis without evidence of diverticulitis.   [MM]  1738 Patient reevaluated, is feeling better  though has persistent right upper quadrant tenderness on exam.  Notes pain was severe in that region last night but was now notably improved but persistent.  General Surgery paged to discuss [MM]  1744 D/w Dr. Cesar of general surgery Will eval shortly [MM]    Clinical Course User Index [MM] Clarine Ozell LABOR, MD     FINAL CLINICAL IMPRESSION(S) / ED DIAGNOSES   Final diagnoses:  Cholecystitis     Rx / DC Orders   ED Discharge Orders     None        Note:  This document was prepared using Dragon voice recognition software and may include unintentional dictation errors.   Clarine Ozell LABOR, MD 04/19/24 [phone]

## 2024-04-19 NOTE — ED Triage Notes (Signed)
 C/O abd pain x 1 day. Tarry stools x 2 days.  Pain goes to back.. Also c/o bloating.  REferred to ED from [facility] for evaluation.  AAOx3.  Skin warm and dry. NAD

## 2024-04-19 NOTE — Op Note (Signed)
 Preoperative diagnosis: Acute cholecystitis  Postoperative diagnosis: Same  Procedure: Robotic Assisted Laparoscopic Cholecystectomy.   Anesthesia: GETA   Surgeon: Dr. Cesar Coe  Wound Classification: Clean Contaminated  Indications: Patient is a 78 y.o. female developed right upper quadrant pain, nausea, leukocytosis and on workup was found to have cholecystitis. Robotic Assisted Laparoscopic cholecystectomy was elected.  Findings:  Critical view of safety achieved Cystic duct and artery identified, ligated and divided Adequate hemostasis  Description of procedure: The patient was placed on the operating table in the supine position. General anesthesia was induced. A time-out was completed verifying correct patient, procedure, site, positioning, and implant(s) and/or special equipment prior to beginning this procedure. An orogastric tube was placed. The abdomen was prepped and draped in the usual sterile fashion.  An incision was made in a natural skin line below the umbilicus.  The fascia was elevated and the Veress needle inserted. Proper position was confirmed by aspiration and saline meniscus test.  The abdomen was insufflated with carbon dioxide to a pressure of 15 mmHg. The patient tolerated insufflation well. A 8-mm trocar was then inserted in optiview fashion.  The laparoscope was inserted and the abdomen inspected. No injuries from initial trocar placement were noted. Additional trocars were then inserted in the following locations: an 8-mm trocar in the left lateral abdomen, and another two 8-mm trocars to the right side of the abdomen 5 cm appart. The umbilical trocar was changed to a 12 mm trocar all under direct visualization. The abdomen was inspected and no abnormalities were found. The table was placed in the reverse Trendelenburg position with the right side up. The robotic arms were docked and target anatomy identified. Instrument inserted under direct visualization.   Filmy adhesions between the gallbladder and omentum, duodenum and transverse colon were lysed with electrocautery. The dome of the gallbladder was grasped with a prograsp and retracted over the dome of the liver. The infundibulum was also grasped with an atraumatic grasper and retracted toward the right lower quadrant. This maneuver exposed Calots triangle. The peritoneum overlying the gallbladder infundibulum was then incised and the cystic duct and cystic artery identified and circumferentially dissected. Critical view of safety reviewed before ligating any structure. Firefly images taken to visualize biliary ducts. The cystic duct and cystic artery were then doubly clipped and divided close to the gallbladder.  The gallbladder was then dissected from its peritoneal attachments by electrocautery. Hemostasis was checked and the gallbladder and contained stones were removed using an endoscopic retrieval bag. The gallbladder was passed off the table as a specimen.  Bleeding was seen on the posterior portion of segment 6.  This was controlled with cautery and Vistaseal was applied.  The gallbladder fossa was copiously irrigated with saline and hemostasis was obtained. There was no evidence of bleeding from the gallbladder fossa or cystic artery or leakage of the bile from the cystic duct stump. Secondary trocars were removed under direct vision. No bleeding was noted. The robotic arms were undoked. The scope was withdrawn and the umbilical trocar removed. The abdomen was allowed to collapse. The fascia of the 12mm trocar sites was closed with figure-of-eight 0 vicryl sutures. The skin was closed with subcuticular sutures of 4-0 monocryl and topical skin adhesive. The orogastric tube was removed.  The patient tolerated the procedure well and was taken to the postanesthesia care unit in stable condition.   Specimen: Gallbladder  Complications: None  EBL: 50 mL

## 2024-04-19 NOTE — Anesthesia Preprocedure Evaluation (Signed)
 Anesthesia Evaluation  Patient identified by MRN, date of birth, ID band Patient awake    Reviewed: Allergy & Precautions, NPO status , Patient's Chart, lab work & pertinent test results  History of Anesthesia Complications Negative for: history of anesthetic complications  Airway Mallampati: II  TM Distance: >3 FB Neck ROM: full    Dental no notable dental hx.    Pulmonary neg pulmonary ROS   Pulmonary exam normal        Cardiovascular hypertension, On Medications negative cardio ROS Normal cardiovascular exam     Neuro/Psych negative neurological ROS  negative psych ROS   GI/Hepatic negative GI ROS, Neg liver ROS,,,  Endo/Other  negative endocrine ROSdiabetes, Well Controlled, Type 2, Oral Hypoglycemic Agents    Renal/GU      Musculoskeletal   Abdominal   Peds  Hematology negative hematology ROS (+)   Anesthesia Other Findings Past Medical History: No date: Chronic lumbar pain No date: Depression No date: Diabetes mellitus without complication (HCC) No date: Hyperlipidemia No date: Hypertension No date: Laryngopharyngeal reflux  Past Surgical History: 02/05/2016: COLONOSCOPY WITH PROPOFOL ; N/A     Comment:  Procedure: COLONOSCOPY WITH PROPOFOL ;  Surgeon: Gladis RAYMOND Mariner, MD;  Location: [facility] ENDOSCOPY;  Service:               Endoscopy;  Laterality: N/A; 08/09/2021: COLONOSCOPY WITH PROPOFOL ; N/A     Comment:  Procedure: COLONOSCOPY WITH PROPOFOL ;  Surgeon:               Maryruth Ole DASEN, MD;  Location: [facility] ENDOSCOPY;                Service: Endoscopy;  Laterality: N/A; 08/09/2021: ESOPHAGOGASTRODUODENOSCOPY (EGD) WITH PROPOFOL ; N/A     Comment:  Procedure: ESOPHAGOGASTRODUODENOSCOPY (EGD) WITH               PROPOFOL ;  Surgeon: Maryruth Ole DASEN, MD;  Location:               [facility] ENDOSCOPY;  Service: Endoscopy;  Laterality: N/A;  BMI    Body Mass Index: 32.12 kg/m       Reproductive/Obstetrics negative OB ROS                              Anesthesia Physical Anesthesia Plan  ASA: 2  Anesthesia Plan: General ETT   Post-op Pain Management: Toradol IV (intra-op)*, Ofirmev IV (intra-op)* and Dilaudid IV   Induction: Intravenous  PONV Risk Score and Plan: 3 and Ondansetron, Dexamethasone and Treatment may vary due to age or medical condition  Airway Management Planned: Oral ETT  Additional Equipment:   Intra-op Plan:   Post-operative Plan: Extubation in OR  Informed Consent: I have reviewed the patients History and Physical, chart, labs and discussed the procedure including the risks, benefits and alternatives for the proposed anesthesia with the patient or authorized representative who has indicated his/her understanding and acceptance.     Dental Advisory Given  Plan Discussed with: Anesthesiologist, CRNA and Surgeon  Anesthesia Plan Comments: (Patient consented for risks of anesthesia including but not limited to:  - adverse reactions to medications - damage to eyes, teeth, lips or other oral mucosa - nerve damage due to positioning  - sore throat or hoarseness - Damage to heart, brain, nerves, lungs, other parts of body or loss of life  Patient voiced understanding and  assent.)         Anesthesia Quick Evaluation

## 2024-04-19 NOTE — Transfer of Care (Signed)
 Immediate Anesthesia Transfer of Care Note  Patient: Patty Robinson  Procedure(s) Performed: CHOLECYSTECTOMY, ROBOT-ASSISTED, LAPAROSCOPIC  Patient Location: PACU  Anesthesia Type:General  Level of Consciousness: drowsy  Airway & Oxygen Therapy: Patient Spontanous Breathing and Patient connected to face mask oxygen  Post-op Assessment: Report given to RN, Post -op Vital signs reviewed and stable, and Patient moving all extremities  Post vital signs: Reviewed and stable  Last Vitals:  Vitals Value Taken Time  BP 121/51 04/19/24 20:46  Temp    Pulse 89 04/19/24 20:50  Resp 9 04/19/24 20:50  SpO2 100 % 04/19/24 20:50  Vitals shown include unfiled device data.  Last Pain:  Vitals:   04/19/24 1609  TempSrc:   PainSc: 0-No pain      Patients Stated Pain Goal: 2 (04/19/24 1252)  Complications: No notable events documented.

## 2024-04-20 ENCOUNTER — Other Ambulatory Visit (HOSPITAL_COMMUNITY): Payer: Self-pay

## 2024-04-20 ENCOUNTER — Other Ambulatory Visit: Payer: Self-pay

## 2024-04-20 LAB — GLUCOSE, CAPILLARY: Glucose-Capillary: 209 mg/dL — ABNORMAL HIGH (ref 70–99)

## 2024-04-20 MED ORDER — HYDROCODONE-ACETAMINOPHEN 5-325 MG PO TABS
1.0000 | ORAL_TABLET | Freq: Four times a day (QID) | ORAL | 0 refills | Status: AC | PRN
  Filled 2024-04-20: qty 12, 3d supply, fill #0

## 2024-04-20 NOTE — Discharge Summary (Signed)
°  Patient ID: Patty Robinson MRN: [id number] DOB/AGE: 09-02-1945 78 y.o.  Admit date: 04/19/2024 Discharge date: 04/20/2024   Discharge Diagnoses:  Principal Problem:   Acute cholecystitis   Procedures: Robotic assisted laparoscopic cholecystectomy  Hospital Course: Patient admitted with acute cholecystitis.  She underwent robotic cholecystectomy last night.  She has been tolerating well.  Pain is controlled.  Vital signs stable.  Physical exam with wound healing adequately without sign of complications.  Physical Exam Vitals reviewed.  HENT:     Head: Normocephalic.  Cardiovascular:     Rate and Rhythm: Normal rate and regular rhythm.  Pulmonary:     Effort: Pulmonary effort is normal.  Abdominal:     General: Abdomen is flat. Bowel sounds are normal.     Palpations: Abdomen is soft.  Skin:    General: Skin is warm.     Capillary Refill: Capillary refill takes less than 2 seconds.  Neurological:     Mental Status: She is alert and oriented to person, place, and time.      Consults: None  Disposition: Discharge disposition: 01-Home or Self Care       Discharge Instructions     Diet - low sodium heart healthy   Complete by: As directed    Increase activity slowly   Complete by: As directed       Allergies as of 04/20/2024       Reactions   Doxycycline Itching   Other Hives   Macadamia Nuts 'causes itching and hives'        Medication List     TAKE these medications    aspirin EC 81 MG tablet Take 81 mg by mouth daily.   Cholecalciferol 125 MCG (5000 UT) capsule Take 5,000 Units by mouth daily.   diclofenac 75 MG EC tablet Commonly known as: VOLTAREN Take 75 mg by mouth 2 (two) times daily.   escitalopram  10 MG tablet Commonly known as: LEXAPRO  Take 10 mg by mouth daily.   glipiZIDE 5 MG 24 hr tablet Commonly known as: GLUCOTROL XL Take 5 mg by mouth daily with breakfast.   hydrochlorothiazide  25 MG tablet Commonly known as:  HYDRODIURIL  Take 25 mg by mouth daily.   HYDROcodone -acetaminophen  5-325 MG tablet Commonly known as: NORCO/VICODIN Take 1 tablet by mouth every 6 (six) hours as needed for up to 3 days.   metFORMIN 500 MG tablet Commonly known as: GLUCOPHAGE Take 500 mg by mouth daily with breakfast.   pantoprazole  40 MG tablet Commonly known as: PROTONIX  Take 40 mg by mouth daily.   ranitidine 150 MG capsule Commonly known as: ZANTAC Take 150 mg by mouth 2 (two) times daily.   simvastatin 10 MG tablet Commonly known as: ZOCOR Take 10 mg by mouth daily.   telmisartan -hydrochlorothiazide  80-12.5 MG tablet Commonly known as: MICARDIS  HCT Take 1 tablet by mouth daily.   vitamin E 180 MG (400 UNITS) capsule Take 400 Units by mouth daily.        Follow-up Information     Rodolph Romano, MD Follow up in 2 week(s).   Specialty: General Surgery Why: Follow up after cholecystectomy Contact information: [address] [place] [address] [phone]                 This discharge encounter was for 35-minute most of the time counseling the patient and coordinating plan of care.

## 2024-04-20 NOTE — Discharge Instructions (Signed)

## 2024-04-20 NOTE — Anesthesia Postprocedure Evaluation (Signed)
 Anesthesia Post Note  Patient: Idell LABOR Gaeta  Procedure(s) Performed: CHOLECYSTECTOMY, ROBOT-ASSISTED, LAPAROSCOPIC  Patient location during evaluation: PACU Anesthesia Type: General Level of consciousness: awake and alert Pain management: pain level controlled Vital Signs Assessment: post-procedure vital signs reviewed and stable Respiratory status: spontaneous breathing, nonlabored ventilation, respiratory function stable and patient connected to nasal cannula oxygen Cardiovascular status: blood pressure returned to baseline and stable Postop Assessment: no apparent nausea or vomiting Anesthetic complications: no   No notable events documented.   Last Vitals:  Vitals:   04/19/24 2131 04/19/24 2221  BP:  131/73  Pulse: 90 83  Resp: 12 18  Temp:  36.6 C  SpO2: 93% 100%    Last Pain:  Vitals:   04/19/24 2221  TempSrc:   PainSc: 9                  Lendia LITTIE Mae

## 2024-04-20 NOTE — Progress Notes (Signed)
 Discharge instructions were reviewed with patient and family. IV x2 were removed. Belongings collected by patient's family . Discharge questions were encouraged and answered

## 2024-04-22 LAB — GLUCOSE, CAPILLARY: Glucose-Capillary: 140 mg/dL — ABNORMAL HIGH (ref 70–99)

## 2024-04-23 LAB — SURGICAL PATHOLOGY
